# Patient Record
Sex: Male | Born: 1945 | Race: White | Hispanic: No
Health system: Southern US, Community
[De-identification: ages and names within clinical notes are randomized; demographics above are authoritative.]

---

## 2017-06-21 ENCOUNTER — Other Ambulatory Visit (HOSPITAL_COMMUNITY): Payer: Self-pay

## 2017-06-21 ENCOUNTER — Inpatient Hospital Stay
Admission: AD | Admit: 2017-06-21 | Discharge: 2017-07-16 | Disposition: A | Discharge status: Skilled Nursing Facility | Payer: Self-pay | Source: Ambulatory Visit | Attending: Internal Medicine | Admitting: Internal Medicine

## 2017-06-21 DIAGNOSIS — J189 Pneumonia, unspecified organism: Secondary | ICD-10-CM

## 2017-06-21 DIAGNOSIS — J9690 Respiratory failure, unspecified, unspecified whether with hypoxia or hypercapnia: Secondary | ICD-10-CM

## 2017-06-21 DIAGNOSIS — Z992 Dependence on renal dialysis: Secondary | ICD-10-CM

## 2017-06-21 DIAGNOSIS — R509 Fever, unspecified: Secondary | ICD-10-CM

## 2017-06-21 DIAGNOSIS — Z431 Encounter for attention to gastrostomy: Secondary | ICD-10-CM

## 2017-06-21 LAB — CBC
HEMATOCRIT: 28.1 % — AB (ref 39.0–52.0)
HEMOGLOBIN: 8.7 g/dL — AB (ref 13.0–17.0)
MCH: 30.4 pg (ref 26.0–34.0)
MCHC: 31 g/dL (ref 30.0–36.0)
MCV: 98.3 fL (ref 78.0–100.0)
Platelets: 276 10*3/uL (ref 150–400)
RBC: 2.86 MIL/uL — AB (ref 4.22–5.81)
RDW: 15.9 % — ABNORMAL HIGH (ref 11.5–15.5)
WBC: 10 10*3/uL (ref 4.0–10.5)

## 2017-06-21 LAB — PROTIME-INR
INR: 1.65
Prothrombin Time: 19.4 seconds — ABNORMAL HIGH (ref 11.4–15.2)

## 2017-06-21 LAB — APTT: aPTT: >200 seconds (ref 24–36)

## 2017-06-21 LAB — HEPARIN LEVEL (UNFRACTIONATED): Heparin Unfractionated: 0.55 IU/mL (ref 0.30–0.70)

## 2017-06-21 MED ORDER — IOPAMIDOL (ISOVUE-300) INJECTION 61%
INTRAVENOUS | Status: AC
  Filled 2017-06-21: qty 50

## 2017-06-22 LAB — COMPREHENSIVE METABOLIC PANEL
ALBUMIN: 2.2 g/dL — AB (ref 3.5–5.0)
ALT: 14 U/L — ABNORMAL LOW (ref 17–63)
ANION GAP: 14 (ref 5–15)
AST: 19 U/L (ref 15–41)
Alkaline Phosphatase: 84 U/L (ref 38–126)
BUN: 25 mg/dL — ABNORMAL HIGH (ref 6–20)
CO2: 22 mmol/L (ref 22–32)
Calcium: 9.7 mg/dL (ref 8.9–10.3)
Chloride: 100 mmol/L — ABNORMAL LOW (ref 101–111)
Creatinine, Ser: 1.74 mg/dL — ABNORMAL HIGH (ref 0.61–1.24)
GFR calc Af Amer: 43 mL/min — ABNORMAL LOW (ref >60)
GFR calc non Af Amer: 37 mL/min — ABNORMAL LOW (ref >60)
GLUCOSE: 108 mg/dL — AB (ref 65–99)
POTASSIUM: 4.1 mmol/L (ref 3.5–5.1)
SODIUM: 136 mmol/L (ref 135–145)
TOTAL PROTEIN: 5.9 g/dL — AB (ref 6.5–8.1)
Total Bilirubin: 0.8 mg/dL (ref 0.3–1.2)

## 2017-06-22 LAB — CBC WITH DIFFERENTIAL/PLATELET
BASOS ABS: 0.1 10*3/uL (ref 0.0–0.1)
Basophils Relative: 1 %
Eosinophils Absolute: 0.9 10*3/uL — ABNORMAL HIGH (ref 0.0–0.7)
Eosinophils Relative: 11 %
HEMATOCRIT: 27.1 % — AB (ref 39.0–52.0)
HEMOGLOBIN: 8.6 g/dL — AB (ref 13.0–17.0)
LYMPHS PCT: 13 %
Lymphs Abs: 1.1 10*3/uL (ref 0.7–4.0)
MCH: 30.9 pg (ref 26.0–34.0)
MCHC: 31.7 g/dL (ref 30.0–36.0)
MCV: 97.5 fL (ref 78.0–100.0)
MONO ABS: 0.6 10*3/uL (ref 0.1–1.0)
Monocytes Relative: 7 %
NEUTROS ABS: 6.1 10*3/uL (ref 1.7–7.7)
Neutrophils Relative %: 68 %
Platelets: 275 10*3/uL (ref 150–400)
RBC: 2.78 MIL/uL — ABNORMAL LOW (ref 4.22–5.81)
RDW: 16.1 % — AB (ref 11.5–15.5)
WBC: 8.9 10*3/uL (ref 4.0–10.5)

## 2017-06-22 LAB — HEPARIN LEVEL (UNFRACTIONATED)
HEPARIN UNFRACTIONATED: 0.5 [IU]/mL (ref 0.30–0.70)
Heparin Unfractionated: 0.1 IU/mL — ABNORMAL LOW (ref 0.30–0.70)
Heparin Unfractionated: 0.41 IU/mL (ref 0.30–0.70)

## 2017-06-22 LAB — PROTIME-INR
INR: 1.74
Prothrombin Time: 20.2 seconds — ABNORMAL HIGH (ref 11.4–15.2)

## 2017-06-23 LAB — HEPARIN LEVEL (UNFRACTIONATED)
HEPARIN UNFRACTIONATED: 0.28 [IU]/mL — AB (ref 0.30–0.70)
Heparin Unfractionated: 0.41 IU/mL (ref 0.30–0.70)
Heparin Unfractionated: 0.32 IU/mL (ref 0.30–0.70)

## 2017-06-23 LAB — PROTIME-INR
INR: 1.39
Prothrombin Time: 17 seconds — ABNORMAL HIGH (ref 11.4–15.2)

## 2017-06-24 LAB — PHOSPHORUS: Phosphorus: 4.4 mg/dL (ref 2.5–4.6)

## 2017-06-24 LAB — PROTIME-INR
INR: 1.45
PROTHROMBIN TIME: 17.5 s — AB (ref 11.4–15.2)

## 2017-06-24 LAB — COMPREHENSIVE METABOLIC PANEL
ALBUMIN: 2.4 g/dL — AB (ref 3.5–5.0)
ALT: 12 U/L — ABNORMAL LOW (ref 17–63)
AST: 18 U/L (ref 15–41)
Alkaline Phosphatase: 75 U/L (ref 38–126)
Anion gap: 10 (ref 5–15)
BUN: 58 mg/dL — ABNORMAL HIGH (ref 6–20)
CALCIUM: 10 mg/dL (ref 8.9–10.3)
CO2: 28 mmol/L (ref 22–32)
CREATININE: 2.36 mg/dL — AB (ref 0.61–1.24)
Chloride: 97 mmol/L — ABNORMAL LOW (ref 101–111)
GFR calc Af Amer: 30 mL/min — ABNORMAL LOW (ref >60)
GFR, EST NON AFRICAN AMERICAN: 26 mL/min — AB (ref >60)
GLUCOSE: 138 mg/dL — AB (ref 65–99)
Potassium: 4 mmol/L (ref 3.5–5.1)
Sodium: 135 mmol/L (ref 135–145)
TOTAL PROTEIN: 6.1 g/dL — AB (ref 6.5–8.1)
Total Bilirubin: 0.5 mg/dL (ref 0.3–1.2)

## 2017-06-24 LAB — TRIGLYCERIDES: TRIGLYCERIDES: 117 mg/dL (ref <150)

## 2017-06-24 LAB — HEPARIN LEVEL (UNFRACTIONATED)
HEPARIN UNFRACTIONATED: 0.26 [IU]/mL — AB (ref 0.30–0.70)
Heparin Unfractionated: 0.11 IU/mL — ABNORMAL LOW (ref 0.30–0.70)

## 2017-06-24 LAB — MAGNESIUM: Magnesium: 2.1 mg/dL (ref 1.7–2.4)

## 2017-06-24 NOTE — Consult Note (Signed)
[facility] CONSULT NOTE    Date: 06/24/2017                  Patient Name:  William Price  MRN: [id number]  DOB: 1946-02-26  Age / Sex: 71 y.o., male         PCP: Patient, No Pcp Per                 Service Requesting Consult: Hospitalist                 Reason for Consult: Acute renal failure            History of Present Illness: Patient is a 71 y.o. male with a PMHx of Parkinson's disease, history of recent aspiration pneumonia, pleural effusion, acute renal failure requiring dialysis, history of atrial fibrillation, diabetes mellitus type 2, tobacco abuse, hypertension, hyperlipidemia who was admitted to [facility] on 06/21/2017 for ongoing treatment of respiratory failure, recent aspiration pneumonia, DVT, and acute renal failure. The patient was admitted to an outside hospital on 05/15/2017 with complaints of chest pain and shortness of breath. At that time he was a days postoperative from a laparoscopic ventral hernia repair. He also was found to be in atrial fibrillation with RVR initially. Patient had dialysis catheter placed on 05/19/2017 and began hemodialysis. He is to remain on dialysis since that point in time. He also had an episode of hematuria that was evaluated by urology. In addition on 06/17/2017 a G-tube was placed for feeding.   Medications: Current medications: Heparin drip, Humalog insulin, aspirin 81 mg daily, Lipitor 10 mg daily at bedtime, diltiazem 60 mg every 6 hours, famotidine 20 mg daily, ipratropium/albuterol 3 ML's inhaled 3 times a day, nicotine 14 mg patch daily, liquid protein 30 cc twice a day, Seroquel 25 mg daily at bedtime, probiotic 1 tablet twice a day  Allergies: No known drug allergies   Past Medical History: Hypertension Hyperlipidemia Atrial fibrillation Episode of acute renal failure requiring dialysis Recent respiratory failure History of Parkinson's disease Recent DVT    Past Surgical  History: Ventral hernia repair Dialysis catheter placement  Family History: No family history of end-stage renal disease.  Social History: Patient is a widower. Lives alone. Recently stopped smoking tobacco.   Review of Systems: Review of Systems  Constitutional: Positive for malaise/fatigue and weight loss. Negative for chills and fever.  HENT: Negative for congestion, hearing loss and sinus pain.   Eyes: Negative for blurred vision and double vision.  Respiratory: Positive for cough, sputum production and shortness of breath.   Cardiovascular: Negative for chest pain, palpitations and orthopnea.  Gastrointestinal: Negative for heartburn, nausea and vomiting.  Genitourinary: Positive for hematuria.  Musculoskeletal: Negative for joint pain and myalgias.  Skin: Negative for itching and rash.  Neurological: Positive for weakness. Negative for dizziness and focal weakness.  Endo/Heme/Allergies: Negative for polydipsia. Does not bruise/bleed easily.  Psychiatric/Behavioral: Negative for depression. The patient is not nervous/anxious.      Vital Signs: Temperature 97.8 pulse 89 respirations 30 blood pressure 140/68   Physical Exam: General: NAD, Resting in bed comfortably   Head: Normocephalic, atraumatic.  Eyes: Anicteric, EOMI  Nose: Mucous membranes moist, not inflammed, nonerythematous.  Throat: Oropharynx nonerythematous, no exudate appreciated.   Neck: Supple, trachea midline.  Lungs:  Scattered rhonchi, normal effort   Heart: S1S2 no rubs  Abdomen:  BS normoactive. Soft, Nondistended, non-tender.  No masses or organomegaly.  Extremities: No pretibial edema.  Neurologic:  A&O X3, Motor strength is 5/5 in the all 4 extremities  Skin: No visible rashes, scars.    Lab results: Basic Metabolic Panel:  Recent Labs Lab 06/22/17 0419  NA 136  K 4.1  CL 100*  CO2 22  GLUCOSE 108*  BUN 25*  CREATININE 1.74*  CALCIUM 9.7    Liver Function Tests:  Recent  Labs Lab 06/22/17 0419  AST 19  ALT 14*  ALKPHOS 84  BILITOT 0.8  PROT 5.9*  ALBUMIN 2.2*   No results for input(s): LIPASE, AMYLASE in the last 168 hours. No results for input(s): AMMONIA in the last 168 hours.  CBC:  Recent Labs Lab 06/21/17 1849 06/22/17 0419  WBC 10.0 8.9  NEUTROABS  --  6.1  HGB 8.7* 8.6*  HCT 28.1* 27.1*  MCV 98.3 97.5  PLT 276 275    Cardiac Enzymes: No results for input(s): CKTOTAL, CKMB, CKMBINDEX, TROPONINI in the last 168 hours.  BNP: Invalid input(s): POCBNP  CBG: No results for input(s): GLUCAP in the last 168 hours.  Microbiology: No results found for this or any previous visit.  Coagulation Studies:  Recent Labs  06/22/17 0419 06/23/17 1347 06/24/17 0514  LABPROT 20.2* 17.0* 17.5*  INR 1.74 1.39 1.45    Urinalysis: No results for input(s): COLORURINE, LABSPEC, PHURINE, GLUCOSEU, HGBUR, BILIRUBINUR, KETONESUR, PROTEINUR, UROBILINOGEN, NITRITE, LEUKOCYTESUR in the last 72 hours.  Invalid input(s): APPERANCEUR    Imaging:  No results found.   Assessment & Plan: Pt is a 71 y.o. male with a PMHx of Parkinson's disease, history of recent aspiration pneumonia, pleural effusion, acute renal failure requiring dialysis, history of atrial fibrillation, diabetes mellitus type 2, tobacco abuse, hypertension, hyperlipidemia who was admitted to [facility] on 06/21/2017 for ongoing treatment of respiratory failure, recent aspiration pneumonia, DVT, and acute renal failure.  1. 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  The patient's baseline creatinine appears to be 1.13. He suffered an episode of acute tubular necrosis while at the outside hospital that ended up requiring hemodialysis. We will plan for dialysis treatment again tomorrow. Ultrafiltration target 1.5 kg.  2. Anemia of chronic kidney disease.  Hemoglobin currently 8.6. We will start the patient on Aranesp per protocol.  3. Secondary hyperparathyroidism.  Check intact PTH and phosphorus with next dialysis treatment.  4. Thanks for consultation.

## 2017-06-25 LAB — BASIC METABOLIC PANEL
ANION GAP: 9 (ref 5–15)
BUN: 67 mg/dL — AB (ref 6–20)
CALCIUM: 10.4 mg/dL — AB (ref 8.9–10.3)
CO2: 28 mmol/L (ref 22–32)
Chloride: 98 mmol/L — ABNORMAL LOW (ref 101–111)
Creatinine, Ser: 2.36 mg/dL — ABNORMAL HIGH (ref 0.61–1.24)
GFR calc Af Amer: 30 mL/min — ABNORMAL LOW (ref >60)
GFR, EST NON AFRICAN AMERICAN: 26 mL/min — AB (ref >60)
GLUCOSE: 158 mg/dL — AB (ref 65–99)
POTASSIUM: 4.1 mmol/L (ref 3.5–5.1)
SODIUM: 135 mmol/L (ref 135–145)

## 2017-06-25 LAB — CBC WITH DIFFERENTIAL/PLATELET
BASOS PCT: 0 %
Basophils Absolute: 0 10*3/uL (ref 0.0–0.1)
EOS ABS: 0.2 10*3/uL (ref 0.0–0.7)
EOS PCT: 2 %
HCT: 28.7 % — ABNORMAL LOW (ref 39.0–52.0)
HEMOGLOBIN: 8.9 g/dL — AB (ref 13.0–17.0)
Lymphocytes Relative: 8 %
Lymphs Abs: 0.8 10*3/uL (ref 0.7–4.0)
MCH: 30.7 pg (ref 26.0–34.0)
MCHC: 31 g/dL (ref 30.0–36.0)
MCV: 99 fL (ref 78.0–100.0)
Monocytes Absolute: 0.8 10*3/uL (ref 0.1–1.0)
Monocytes Relative: 8 %
NEUTROS PCT: 82 %
Neutro Abs: 8.3 10*3/uL — ABNORMAL HIGH (ref 1.7–7.7)
PLATELETS: 331 10*3/uL (ref 150–400)
RBC: 2.9 MIL/uL — AB (ref 4.22–5.81)
RDW: 16.2 % — ABNORMAL HIGH (ref 11.5–15.5)
WBC: 10.2 10*3/uL (ref 4.0–10.5)

## 2017-06-25 LAB — ALBUMIN: ALBUMIN: 2.5 g/dL — AB (ref 3.5–5.0)

## 2017-06-25 LAB — MAGNESIUM: MAGNESIUM: 2.3 mg/dL (ref 1.7–2.4)

## 2017-06-25 LAB — PROTIME-INR
INR: 2.24
PROTHROMBIN TIME: 24.6 s — AB (ref 11.4–15.2)

## 2017-06-25 LAB — HEPARIN LEVEL (UNFRACTIONATED)
HEPARIN UNFRACTIONATED: 0.2 [IU]/mL — AB (ref 0.30–0.70)
Heparin Unfractionated: 0.25 IU/mL — ABNORMAL LOW (ref 0.30–0.70)

## 2017-06-25 LAB — PHOSPHORUS: PHOSPHORUS: 5 mg/dL — AB (ref 2.5–4.6)

## 2017-06-26 LAB — HEPATITIS B SURFACE ANTIGEN: Hepatitis B Surface Ag: NEGATIVE

## 2017-06-26 LAB — BASIC METABOLIC PANEL
Anion gap: 10 (ref 5–15)
BUN: 47 mg/dL — AB (ref 6–20)
CO2: 27 mmol/L (ref 22–32)
Calcium: 9.8 mg/dL (ref 8.9–10.3)
Chloride: 98 mmol/L — ABNORMAL LOW (ref 101–111)
Creatinine, Ser: 1.96 mg/dL — ABNORMAL HIGH (ref 0.61–1.24)
GFR calc Af Amer: 38 mL/min — ABNORMAL LOW (ref >60)
GFR, EST NON AFRICAN AMERICAN: 32 mL/min — AB (ref >60)
GLUCOSE: 137 mg/dL — AB (ref 65–99)
Potassium: 4 mmol/L (ref 3.5–5.1)
Sodium: 135 mmol/L (ref 135–145)

## 2017-06-26 LAB — PROTIME-INR
INR: 2.96
Prothrombin Time: 30.6 seconds — ABNORMAL HIGH (ref 11.4–15.2)

## 2017-06-26 LAB — HEPATITIS B SURFACE ANTIBODY, QUANTITATIVE: Hep B S AB Quant (Post): <3.1 m[IU]/mL — ABNORMAL LOW (ref >9.9)

## 2017-06-26 LAB — PARATHYROID HORMONE, INTACT (NO CA): PTH: 23 pg/mL (ref 15–65)

## 2017-06-26 LAB — HEPATITIS B CORE ANTIBODY, IGM: HEP B C IGM: NEGATIVE

## 2017-06-26 LAB — HEPARIN LEVEL (UNFRACTIONATED)

## 2017-06-26 NOTE — Progress Notes (Signed)
  [facility]  ROUNDING NOTE   Subjective:  Unclear if renal function improving.  Cr down to 1.96 but this is under the influence of dialysis.   Objective:  Vital signs in last 24 hours:  Temperature 99.2 Pulse 105 Respirations 25 Blood pressure: 149/81  Physical Exam: General: No acute distress  Head: Normocephalic, atraumatic. Moist oral mucosal membranes  Eyes: Anicteric  Neck: Supple, trachea midline  Lungs:  Scattered rhonchi, normal effort  Heart: S1S2 no rubs  Abdomen:  Soft, nontender, bowel sounds present  Extremities: trace peripheral edema.  Neurologic: Awake, alert, following commands  Skin: No lesions  Access: R IJ permcath    Basic Metabolic Panel:  Recent Labs Lab 06/22/17 0419 06/24/17 1253 06/25/17 0657 06/26/17 0701  NA 136 135 135 135  K 4.1 4.0 4.1 4.0  CL 100* 97* 98* 98*  CO2 22 28 28 27   GLUCOSE 108* 138* 158* 137*  BUN 25* 58* 67* 47*  CREATININE 1.74* 2.36* 2.36* 1.96*  CALCIUM 9.7 10.0 10.4* 9.8  MG  --  2.1 2.3  --   PHOS  --  4.4 5.0*  --     Liver Function Tests:  Recent Labs Lab 06/22/17 0419 06/24/17 1253 06/25/17 0657  AST 19 18  --   ALT 14* 12*  --   ALKPHOS 84 75  --   BILITOT 0.8 0.5  --   PROT 5.9* 6.1*  --   ALBUMIN 2.2* 2.4* 2.5*   No results for input(s): LIPASE, AMYLASE in the last 168 hours. No results for input(s): AMMONIA in the last 168 hours.  CBC:  Recent Labs Lab 06/21/17 1849 06/22/17 0419 06/25/17 0657  WBC 10.0 8.9 10.2  NEUTROABS  --  6.1 8.3*  HGB 8.7* 8.6* 8.9*  HCT 28.1* 27.1* 28.7*  MCV 98.3 97.5 99.0  PLT 276 275 331    Cardiac Enzymes: No results for input(s): CKTOTAL, CKMB, CKMBINDEX, TROPONINI in the last 168 hours.  BNP: Invalid input(s): POCBNP  CBG: No results for input(s): GLUCAP in the last 168 hours.  Microbiology: No results found for this or any previous visit.  Coagulation Studies:  Recent Labs  06/24/17 0514 06/25/17 0657 06/26/17 0701   LABPROT 17.5* 24.6* 30.6*  INR 1.45 2.24 2.96    Urinalysis: No results for input(s): COLORURINE, LABSPEC, PHURINE, GLUCOSEU, HGBUR, BILIRUBINUR, KETONESUR, PROTEINUR, UROBILINOGEN, NITRITE, LEUKOCYTESUR in the last 72 hours.  Invalid input(s): APPERANCEUR    Imaging: No results found.   Medications:       Assessment/ Plan:  71 y.o. male with a PMHx of Parkinson's disease, history of recent aspiration pneumonia, pleural effusion, acute renal failure requiring dialysis, history of atrial fibrillation, diabetes mellitus type 2, tobacco abuse, hypertension, hyperlipidemia who was admitted to [facility] on 06/21/2017 for ongoing treatment of respiratory failure, recent aspiration pneumonia, DVT, and acute renal failure.  1. 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  BUN down to 47 with a creatinine of 1.96. Patient may be experiencing some renal recovery. We will plan for 1 additional dialysis treatment tomorrow and reassess thereafter.  2. Anemia of chronic kidney disease.   Hemoglobin up to 8.9. Continue to monitor CBC.  3. Secondary hyperparathyroidism.  Phosphorus 5.0 and at target yesterday. Continue to monitor.   LOS: 0 Linah Klapper 9/5/20183:34 PM

## 2017-06-27 ENCOUNTER — Other Ambulatory Visit (HOSPITAL_COMMUNITY): Payer: Self-pay

## 2017-06-27 LAB — CBC
HEMATOCRIT: 28.3 % — AB (ref 39.0–52.0)
Hemoglobin: 8.8 g/dL — ABNORMAL LOW (ref 13.0–17.0)
MCH: 31.2 pg (ref 26.0–34.0)
MCHC: 31.1 g/dL (ref 30.0–36.0)
MCV: 100.4 fL — ABNORMAL HIGH (ref 78.0–100.0)
Platelets: 296 10*3/uL (ref 150–400)
RBC: 2.82 MIL/uL — ABNORMAL LOW (ref 4.22–5.81)
RDW: 16.8 % — AB (ref 11.5–15.5)
WBC: 10.2 10*3/uL (ref 4.0–10.5)

## 2017-06-27 LAB — BASIC METABOLIC PANEL
ANION GAP: 10 (ref 5–15)
BUN: 60 mg/dL — AB (ref 6–20)
CALCIUM: 10.3 mg/dL (ref 8.9–10.3)
CO2: 30 mmol/L (ref 22–32)
Chloride: 97 mmol/L — ABNORMAL LOW (ref 101–111)
Creatinine, Ser: 2.13 mg/dL — ABNORMAL HIGH (ref 0.61–1.24)
GFR calc Af Amer: 34 mL/min — ABNORMAL LOW (ref >60)
GFR, EST NON AFRICAN AMERICAN: 29 mL/min — AB (ref >60)
Glucose, Bld: 147 mg/dL — ABNORMAL HIGH (ref 65–99)
POTASSIUM: 3.8 mmol/L (ref 3.5–5.1)
Sodium: 137 mmol/L (ref 135–145)

## 2017-06-27 LAB — RENAL FUNCTION PANEL
ALBUMIN: 2.5 g/dL — AB (ref 3.5–5.0)
ALBUMIN: 2.4 g/dL — AB (ref 3.5–5.0)
Anion gap: 11 (ref 5–15)
Anion gap: 7 (ref 5–15)
BUN: 59 mg/dL — AB (ref 6–20)
BUN: 19 mg/dL (ref 6–20)
CALCIUM: 10.3 mg/dL (ref 8.9–10.3)
CALCIUM: 8.8 mg/dL — AB (ref 8.9–10.3)
CO2: 29 mmol/L (ref 22–32)
CO2: 29 mmol/L (ref 22–32)
CREATININE: 2.11 mg/dL — AB (ref 0.61–1.24)
CREATININE: 1.01 mg/dL (ref 0.61–1.24)
Chloride: 97 mmol/L — ABNORMAL LOW (ref 101–111)
Chloride: 99 mmol/L — ABNORMAL LOW (ref 101–111)
GFR calc Af Amer: 34 mL/min — ABNORMAL LOW (ref >60)
GFR calc Af Amer: >60 mL/min (ref >60)
GFR calc non Af Amer: >60 mL/min (ref >60)
GFR, EST NON AFRICAN AMERICAN: 30 mL/min — AB (ref >60)
GLUCOSE: 146 mg/dL — AB (ref 65–99)
Glucose, Bld: 147 mg/dL — ABNORMAL HIGH (ref 65–99)
PHOSPHORUS: 4.2 mg/dL (ref 2.5–4.6)
PHOSPHORUS: 1.8 mg/dL — AB (ref 2.5–4.6)
POTASSIUM: 3.8 mmol/L (ref 3.5–5.1)
Potassium: 3.5 mmol/L (ref 3.5–5.1)
SODIUM: 137 mmol/L (ref 135–145)
SODIUM: 135 mmol/L (ref 135–145)

## 2017-06-27 LAB — PROTIME-INR
INR: 3.03
Prothrombin Time: 31.1 seconds — ABNORMAL HIGH (ref 11.4–15.2)

## 2017-06-27 LAB — HEPARIN LEVEL (UNFRACTIONATED)

## 2017-06-27 LAB — MAGNESIUM: MAGNESIUM: 2.3 mg/dL (ref 1.7–2.4)

## 2017-06-28 LAB — RENAL FUNCTION PANEL
ANION GAP: 7 (ref 5–15)
Albumin: 2.4 g/dL — ABNORMAL LOW (ref 3.5–5.0)
BUN: 37 mg/dL — ABNORMAL HIGH (ref 6–20)
CALCIUM: 9.5 mg/dL (ref 8.9–10.3)
CO2: 31 mmol/L (ref 22–32)
Chloride: 98 mmol/L — ABNORMAL LOW (ref 101–111)
Creatinine, Ser: 1.62 mg/dL — ABNORMAL HIGH (ref 0.61–1.24)
GFR calc non Af Amer: 41 mL/min — ABNORMAL LOW (ref >60)
GFR, EST AFRICAN AMERICAN: 47 mL/min — AB (ref >60)
Glucose, Bld: 127 mg/dL — ABNORMAL HIGH (ref 65–99)
Phosphorus: 3.4 mg/dL (ref 2.5–4.6)
Potassium: 3.7 mmol/L (ref 3.5–5.1)
SODIUM: 136 mmol/L (ref 135–145)

## 2017-06-28 LAB — BASIC METABOLIC PANEL
Anion gap: 7 (ref 5–15)
BUN: 37 mg/dL — AB (ref 6–20)
CHLORIDE: 98 mmol/L — AB (ref 101–111)
CO2: 30 mmol/L (ref 22–32)
CREATININE: 1.64 mg/dL — AB (ref 0.61–1.24)
Calcium: 9.4 mg/dL (ref 8.9–10.3)
GFR calc Af Amer: 47 mL/min — ABNORMAL LOW (ref >60)
GFR calc non Af Amer: 40 mL/min — ABNORMAL LOW (ref >60)
GLUCOSE: 128 mg/dL — AB (ref 65–99)
Potassium: 3.7 mmol/L (ref 3.5–5.1)
Sodium: 135 mmol/L (ref 135–145)

## 2017-06-28 LAB — PROTIME-INR
INR: 2.43
Prothrombin Time: 26.2 seconds — ABNORMAL HIGH (ref 11.4–15.2)

## 2017-06-28 LAB — CBC
HEMATOCRIT: 28.8 % — AB (ref 39.0–52.0)
HEMOGLOBIN: 8.7 g/dL — AB (ref 13.0–17.0)
MCH: 30.3 pg (ref 26.0–34.0)
MCHC: 30.2 g/dL (ref 30.0–36.0)
MCV: 100.3 fL — ABNORMAL HIGH (ref 78.0–100.0)
Platelets: 316 10*3/uL (ref 150–400)
RBC: 2.87 MIL/uL — ABNORMAL LOW (ref 4.22–5.81)
RDW: 16.5 % — ABNORMAL HIGH (ref 11.5–15.5)
WBC: 10.1 10*3/uL (ref 4.0–10.5)

## 2017-06-28 LAB — HEPARIN LEVEL (UNFRACTIONATED): Heparin Unfractionated: <0.1 IU/mL — ABNORMAL LOW (ref 0.30–0.70)

## 2017-06-28 LAB — MAGNESIUM: Magnesium: 2 mg/dL (ref 1.7–2.4)

## 2017-06-28 NOTE — Progress Notes (Signed)
[facility]  ROUNDING NOTE   Subjective:  Patient seen at bedside.  Renal function improved. Patient did make some urine yesterday approximately 1 L. He also had some pulmonary vascular congestion on chest x-ray yesterday.  Objective:  Vital signs in last 24 hours:  Temperature 98.6 pulse 82 respirations 25 blood pressure 124/69   Physical Exam: General: No acute distress  Head: Normocephalic, atraumatic. Moist oral mucosal membranes  Eyes: Anicteric  Neck: Supple, trachea midline  Lungs:  Scattered rhonchi, normal effort  Heart: S1S2 no rubs  Abdomen:  Soft, nontender, bowel sounds present  Extremities: trace peripheral edema.  Neurologic: Awake, alert, following commands  Skin: No lesions  Access: R IJ permcath    Basic Metabolic Panel:  Recent Labs Lab 06/24/17 1253 06/25/17 0657 06/26/17 0701 06/27/17 0621 06/27/17 1419 06/28/17 0531  NA 135 135 135 137  137 135 135  136  K 4.0 4.1 4.0 3.8  3.8 3.5 3.7  3.7  CL 97* 98* 98* 97*  97* 99* 98*  98*  CO2 28 28 27 29  30 29 30  31   GLUCOSE 138* 158* 137* 147*  147* 146* 128*  127*  BUN 58* 67* 47* 59*  60* 19 37*  37*  CREATININE 2.36* 2.36* 1.96* 2.11*  2.13* 1.01 1.64*  1.62*  CALCIUM 10.0 10.4* 9.8 10.3  10.3 8.8* 9.4  9.5  MG 2.1 2.3  --  2.3  --  2.0  PHOS 4.4 5.0*  --  4.2 1.8* 3.4    Liver Function Tests:  Recent Labs Lab 06/22/17 0419 06/24/17 1253 06/25/17 0657 06/27/17 0621 06/27/17 1419 06/28/17 0531  AST 19 18  --   --   --   --   ALT 14* 12*  --   --   --   --   ALKPHOS 84 75  --   --   --   --   BILITOT 0.8 0.5  --   --   --   --   PROT 5.9* 6.1*  --   --   --   --   ALBUMIN 2.2* 2.4* 2.5* 2.5* 2.4* 2.4*   No results for input(s): LIPASE, AMYLASE in the last 168 hours. No results for input(s): AMMONIA in the last 168 hours.  CBC:  Recent Labs Lab 06/21/17 1849 06/22/17 0419 06/25/17 0657 06/27/17 0621 06/28/17 0531  WBC 10.0 8.9 10.2 10.2 10.1   NEUTROABS  --  6.1 8.3*  --   --   HGB 8.7* 8.6* 8.9* 8.8* 8.7*  HCT 28.1* 27.1* 28.7* 28.3* 28.8*  MCV 98.3 97.5 99.0 100.4* 100.3*  PLT 276 275 331 296 316    Cardiac Enzymes: No results for input(s): CKTOTAL, CKMB, CKMBINDEX, TROPONINI in the last 168 hours.  BNP: Invalid input(s): POCBNP  CBG: No results for input(s): GLUCAP in the last 168 hours.  Microbiology: No results found for this or any previous visit.  Coagulation Studies:  Recent Labs  06/26/17 0701 06/27/17 0617 06/28/17 0531  LABPROT 30.6* 31.1* 26.2*  INR 2.96 3.03 2.43    Urinalysis: No results for input(s): COLORURINE, LABSPEC, PHURINE, GLUCOSEU, HGBUR, BILIRUBINUR, KETONESUR, PROTEINUR, UROBILINOGEN, NITRITE, LEUKOCYTESUR in the last 72 hours.  Invalid input(s): APPERANCEUR    Imaging: Dg Chest Port 1 View  Result Date: 06/27/2017 CLINICAL DATA:  Shortness of Breath EXAM: PORTABLE CHEST 1 VIEW COMPARISON:  June 21, 2017 FINDINGS: There are bilateral pleural effusions. There is interstitial edema bilaterally. There is patchy airspace  opacity in the upper lobes, more on the left than on the right. There is consolidation in the left lower lobe. There is cardiomegaly with pulmonary venous hypertension. No evident adenopathy. Central catheter tip is at the cavoatrial junction. No pneumothorax. There is aortic atherosclerosis. No bone lesions. IMPRESSION: Findings indicative of congestive heart failure with increase in edema compared to most recent study. Suspect alveolar edema left upper lobe. Consolidation left lower lobe raises concern for superimposed pneumonia, although this opacity could be due to a combination of alveolar edema and atelectasis. There is aortic atherosclerosis. Central catheter as described without pneumothorax. Aortic Atherosclerosis (ICD10-I70.0). Electronically Signed   By: Bretta Bang III M.D.   On: 06/27/2017 11:05     Medications:       Assessment/ Plan:  71 y.o.  male with a PMHx of Parkinson's disease, history of recent aspiration pneumonia, pleural effusion, acute renal failure requiring dialysis, history of atrial fibrillation, diabetes mellitus type 2, tobacco abuse, hypertension, hyperlipidemia who was admitted to [facility] on 06/21/2017 for ongoing treatment of respiratory failure, recent aspiration pneumonia, DVT, and acute renal failure.  1. 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  renal parameters continue to improve. BUN currently down to 37 with a creatinine 1.6. Therefore we will hold further dialysis treatments. We may need to reinitiate if renal function starts to deteriorate.  Pulmonary avascular congestion noted on chest x-ray. Okay to administer Lasix at this time.  2. Anemia of chronic kidney disease.   Hemoglobin currently 8.7. Hold off on Aranesp for now.  3. Secondary hyperparathyroidism.  Phosphorus currently 3.4 and at target. Continue to monitor bone marrow metabolism parameters.  LOS: 0 [place] Every 9/7/20188:22 AM

## 2017-06-29 LAB — PROTIME-INR
INR: 2.28
PROTHROMBIN TIME: 24.9 s — AB (ref 11.4–15.2)

## 2017-06-29 LAB — HEPARIN LEVEL (UNFRACTIONATED): Heparin Unfractionated: <0.1 IU/mL — ABNORMAL LOW (ref 0.30–0.70)

## 2017-06-30 LAB — HEPARIN LEVEL (UNFRACTIONATED): Heparin Unfractionated: <0.1 IU/mL — ABNORMAL LOW (ref 0.30–0.70)

## 2017-07-01 LAB — COMPREHENSIVE METABOLIC PANEL
ALK PHOS: 68 U/L (ref 38–126)
ALT: 15 U/L — AB (ref 17–63)
AST: 17 U/L (ref 15–41)
Albumin: 2.4 g/dL — ABNORMAL LOW (ref 3.5–5.0)
Anion gap: 9 (ref 5–15)
BILIRUBIN TOTAL: 0.6 mg/dL (ref 0.3–1.2)
BUN: 77 mg/dL — AB (ref 6–20)
CHLORIDE: 97 mmol/L — AB (ref 101–111)
CO2: 33 mmol/L — ABNORMAL HIGH (ref 22–32)
CREATININE: 2.48 mg/dL — AB (ref 0.61–1.24)
Calcium: 9.9 mg/dL (ref 8.9–10.3)
GFR calc Af Amer: 28 mL/min — ABNORMAL LOW (ref >60)
GFR, EST NON AFRICAN AMERICAN: 24 mL/min — AB (ref >60)
Glucose, Bld: 119 mg/dL — ABNORMAL HIGH (ref 65–99)
Potassium: 3.2 mmol/L — ABNORMAL LOW (ref 3.5–5.1)
Sodium: 139 mmol/L (ref 135–145)
TOTAL PROTEIN: 6.3 g/dL — AB (ref 6.5–8.1)

## 2017-07-01 LAB — CBC
HEMATOCRIT: 28.9 % — AB (ref 39.0–52.0)
Hemoglobin: 8.8 g/dL — ABNORMAL LOW (ref 13.0–17.0)
MCH: 30.2 pg (ref 26.0–34.0)
MCHC: 30.4 g/dL (ref 30.0–36.0)
MCV: 99.3 fL (ref 78.0–100.0)
Platelets: 314 10*3/uL (ref 150–400)
RBC: 2.91 MIL/uL — ABNORMAL LOW (ref 4.22–5.81)
RDW: 16.5 % — AB (ref 11.5–15.5)
WBC: 11 10*3/uL — AB (ref 4.0–10.5)

## 2017-07-01 LAB — TRIGLYCERIDES: Triglycerides: 106 mg/dL (ref <150)

## 2017-07-01 LAB — PROTIME-INR
INR: 2.65
Prothrombin Time: 28 seconds — ABNORMAL HIGH (ref 11.4–15.2)

## 2017-07-01 LAB — MAGNESIUM: Magnesium: 2 mg/dL (ref 1.7–2.4)

## 2017-07-01 LAB — PHOSPHORUS: Phosphorus: 4.8 mg/dL — ABNORMAL HIGH (ref 2.5–4.6)

## 2017-07-01 NOTE — Progress Notes (Signed)
[facility]  ROUNDING NOTE   Subjective:  Patient states that he is making 812 100 cc of urine per day.  renal function appears to be worse in terms of BUN and creatinine however.   Objective:  Vital signs in last 24 hours:  Temperature 97.5 pulse 72 respirations 31 blood pressure 148/76  Physical Exam: General: No acute distress  Head: Normocephalic, atraumatic. Moist oral mucosal membranes  Eyes: Anicteric  Neck: Supple, trachea midline  Lungs:  Scattered rhonchi, normal effort  Heart: S1S2 no rubs  Abdomen:  Soft, nontender, bowel sounds present  Extremities: trace peripheral edema.  Neurologic: Awake, alert, following commands  Skin: No lesions  Access: R IJ permcath    Basic Metabolic Panel:  Recent Labs Lab 06/25/17 0657 06/26/17 0701 06/27/17 0621 06/27/17 1419 06/28/17 0531 07/01/17 0508  NA 135 135 137  137 135 135  136 139  K 4.1 4.0 3.8  3.8 3.5 3.7  3.7 3.2*  CL 98* 98* 97*  97* 99* 98*  98* 97*  CO2 28 27 29  30 29 30  31  33*  GLUCOSE 158* 137* 147*  147* 146* 128*  127* 119*  BUN 67* 47* 59*  60* 19 37*  37* 77*  CREATININE 2.36* 1.96* 2.11*  2.13* 1.01 1.64*  1.62* 2.48*  CALCIUM 10.4* 9.8 10.3  10.3 8.8* 9.4  9.5 9.9  MG 2.3  --  2.3  --  2.0 2.0  PHOS 5.0*  --  4.2 1.8* 3.4 4.8*    Liver Function Tests:  Recent Labs Lab 06/25/17 0657 06/27/17 0621 06/27/17 1419 06/28/17 0531 07/01/17 0508  AST  --   --   --   --  17  ALT  --   --   --   --  15*  ALKPHOS  --   --   --   --  68  BILITOT  --   --   --   --  0.6  PROT  --   --   --   --  6.3*  ALBUMIN 2.5* 2.5* 2.4* 2.4* 2.4*   No results for input(s): LIPASE, AMYLASE in the last 168 hours. No results for input(s): AMMONIA in the last 168 hours.  CBC:  Recent Labs Lab 06/25/17 0657 06/27/17 0621 06/28/17 0531 07/01/17 0508  WBC 10.2 10.2 10.1 11.0*  NEUTROABS 8.3*  --   --   --   HGB 8.9* 8.8* 8.7* 8.8*  HCT 28.7* 28.3* 28.8* 28.9*  MCV 99.0  100.4* 100.3* 99.3  PLT 331 296 316 314    Cardiac Enzymes: No results for input(s): CKTOTAL, CKMB, CKMBINDEX, TROPONINI in the last 168 hours.  BNP: Invalid input(s): POCBNP  CBG: No results for input(s): GLUCAP in the last 168 hours.  Microbiology: No results found for this or any previous visit.  Coagulation Studies:  Recent Labs  06/29/17 0459 07/01/17 0508  LABPROT 24.9* 28.0*  INR 2.28 2.65    Urinalysis: No results for input(s): COLORURINE, LABSPEC, PHURINE, GLUCOSEU, HGBUR, BILIRUBINUR, KETONESUR, PROTEINUR, UROBILINOGEN, NITRITE, LEUKOCYTESUR in the last 72 hours.  Invalid input(s): APPERANCEUR    Imaging: No results found.   Medications:       Assessment/ Plan:  71 y.o. male with a PMHx of Parkinson's disease, history of recent aspiration pneumonia, pleural effusion, acute renal failure requiring dialysis, history of atrial fibrillation, diabetes mellitus type 2, tobacco abuse, hypertension, hyperlipidemia who was admitted to [facility] on 06/21/2017 for ongoing treatment of respiratory  failure, recent aspiration pneumonia, DVT, and acute renal failure.  1. 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  BUN and creatinine are higher at the moment and 77 and 2.48 respectively. However patient is making adequate amounts of urine. Therefore no urgent indication for dialysis at the moment. We will reassess for this on Wednesday and Friday.  2. Anemia of chronic kidney disease.   Hemoglobin relatively stable at 8.8. Continue to monitor.  3. Secondary hyperparathyroidism.  Phosphorus currently up to 4.8. We will need to continue to monitor this.   LOS: 0 Elo Marmolejos 9/10/20183:25 PM

## 2017-07-02 ENCOUNTER — Other Ambulatory Visit (HOSPITAL_COMMUNITY): Payer: Self-pay

## 2017-07-02 LAB — POTASSIUM: Potassium: 3.3 mmol/L — ABNORMAL LOW (ref 3.5–5.1)

## 2017-07-03 LAB — CBC
HEMATOCRIT: 30.7 % — AB (ref 39.0–52.0)
HEMOGLOBIN: 9.5 g/dL — AB (ref 13.0–17.0)
MCH: 30.8 pg (ref 26.0–34.0)
MCHC: 30.9 g/dL (ref 30.0–36.0)
MCV: 99.7 fL (ref 78.0–100.0)
PLATELETS: 289 10*3/uL (ref 150–400)
RBC: 3.08 MIL/uL — AB (ref 4.22–5.81)
RDW: 16.3 % — ABNORMAL HIGH (ref 11.5–15.5)
WBC: 11.6 10*3/uL — ABNORMAL HIGH (ref 4.0–10.5)

## 2017-07-03 LAB — RENAL FUNCTION PANEL
ANION GAP: 13 (ref 5–15)
Albumin: 2.7 g/dL — ABNORMAL LOW (ref 3.5–5.0)
BUN: 97 mg/dL — ABNORMAL HIGH (ref 6–20)
CHLORIDE: 92 mmol/L — AB (ref 101–111)
CO2: 31 mmol/L (ref 22–32)
Calcium: 9.9 mg/dL (ref 8.9–10.3)
Creatinine, Ser: 2.73 mg/dL — ABNORMAL HIGH (ref 0.61–1.24)
GFR, EST AFRICAN AMERICAN: 25 mL/min — AB (ref >60)
GFR, EST NON AFRICAN AMERICAN: 22 mL/min — AB (ref >60)
Glucose, Bld: 111 mg/dL — ABNORMAL HIGH (ref 65–99)
POTASSIUM: 3.9 mmol/L (ref 3.5–5.1)
Phosphorus: 4.5 mg/dL (ref 2.5–4.6)
Sodium: 136 mmol/L (ref 135–145)

## 2017-07-03 LAB — BASIC METABOLIC PANEL
Anion gap: 13 (ref 5–15)
BUN: 100 mg/dL — AB (ref 6–20)
CHLORIDE: 93 mmol/L — AB (ref 101–111)
CO2: 29 mmol/L (ref 22–32)
CREATININE: 2.69 mg/dL — AB (ref 0.61–1.24)
Calcium: 9.5 mg/dL (ref 8.9–10.3)
GFR calc Af Amer: 26 mL/min — ABNORMAL LOW (ref >60)
GFR calc non Af Amer: 22 mL/min — ABNORMAL LOW (ref >60)
Glucose, Bld: 108 mg/dL — ABNORMAL HIGH (ref 65–99)
POTASSIUM: 4.2 mmol/L (ref 3.5–5.1)
Sodium: 135 mmol/L (ref 135–145)

## 2017-07-03 LAB — PROTIME-INR
INR: 3.02
PROTHROMBIN TIME: 31.1 s — AB (ref 11.4–15.2)

## 2017-07-03 NOTE — Progress Notes (Signed)
[facility]  ROUNDING NOTE   Subjective:  Renal parameters appear to be worsening. BUN up to 100.  therefore we have decided to proceed with dialysis today. Patient sitting up in chair.   Objective:  Vital signs in last 24 hours:  Temperature 96.4 pulse 76 respirations 32 blood pressure 122/71  Physical Exam: General: No acute distress  Head: Normocephalic, atraumatic. Moist oral mucosal membranes  Eyes: Anicteric  Neck: Supple, trachea midline  Lungs:  Scattered rhonchi, normal effort  Heart: S1S2 no rubs  Abdomen:  Soft, nontender, bowel sounds present  Extremities: trace peripheral edema.  Neurologic: Awake, alert, following commands  Skin: No lesions  Access: R IJ permcath    Basic Metabolic Panel:  Recent Labs Lab 06/27/17 0621 06/27/17 1419 06/28/17 0531 07/01/17 0508 07/02/17 0835 07/03/17 0529  NA 137  137 135 135  136 139  --  135  K 3.8  3.8 3.5 3.7  3.7 3.2* 3.3* 4.2  CL 97*  97* 99* 98*  98* 97*  --  93*  CO2 29  30 29 30  31  33*  --  29  GLUCOSE 147*  147* 146* 128*  127* 119*  --  108*  BUN 59*  60* 19 37*  37* 77*  --  100*  CREATININE 2.11*  2.13* 1.01 1.64*  1.62* 2.48*  --  2.69*  CALCIUM 10.3  10.3 8.8* 9.4  9.5 9.9  --  9.5  MG 2.3  --  2.0 2.0  --   --   PHOS 4.2 1.8* 3.4 4.8*  --   --     Liver Function Tests:  Recent Labs Lab 06/27/17 0621 06/27/17 1419 06/28/17 0531 07/01/17 0508  AST  --   --   --  17  ALT  --   --   --  15*  ALKPHOS  --   --   --  68  BILITOT  --   --   --  0.6  PROT  --   --   --  6.3*  ALBUMIN 2.5* 2.4* 2.4* 2.4*   No results for input(s): LIPASE, AMYLASE in the last 168 hours. No results for input(s): AMMONIA in the last 168 hours.  CBC:  Recent Labs Lab 06/27/17 0621 06/28/17 0531 07/01/17 0508  WBC 10.2 10.1 11.0*  HGB 8.8* 8.7* 8.8*  HCT 28.3* 28.8* 28.9*  MCV 100.4* 100.3* 99.3  PLT 296 316 314    Cardiac Enzymes: No results for input(s): CKTOTAL, CKMB,  CKMBINDEX, TROPONINI in the last 168 hours.  BNP: Invalid input(s): POCBNP  CBG: No results for input(s): GLUCAP in the last 168 hours.  Microbiology: No results found for this or any previous visit.  Coagulation Studies:  Recent Labs  07/01/17 0508 07/03/17 0726  LABPROT 28.0* 31.1*  INR 2.65 3.02    Urinalysis: No results for input(s): COLORURINE, LABSPEC, PHURINE, GLUCOSEU, HGBUR, BILIRUBINUR, KETONESUR, PROTEINUR, UROBILINOGEN, NITRITE, LEUKOCYTESUR in the last 72 hours.  Invalid input(s): APPERANCEUR    Imaging: No results found.   Medications:       Assessment/ Plan:  71 y.o. male with a PMHx of Parkinson's disease, history of recent aspiration pneumonia, pleural effusion, acute renal failure requiring dialysis, history of atrial fibrillation, diabetes mellitus type 2, tobacco abuse, hypertension, hyperlipidemia who was admitted to [facility] on 06/21/2017 for ongoing treatment of respiratory failure, recent aspiration pneumonia, DVT, and acute renal failure.  1. 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  Renal function  worsening. BUN up to 100 with a creatinine of 2.69. Therefore we will proceed with a short dialysis treatment today of 2 hours. Ultrafiltration target 1 kg.  2. Anemia of chronic kidney disease.  Hemoglobin was 8.8 at last check. We will continue to monitor. Hold off on Aranesp for now.  3. Secondary hyperparathyroidism.  No new phosphorus today. Most recent serum phosphorus was 4.8 which was higher than before. We will need to continue to periodically monitor.  LOS: 0 [place] Trouble[place] 9/12/20182:48 PM

## 2017-07-04 LAB — PROTIME-INR
INR: 3.27
Prothrombin Time: 33 seconds — ABNORMAL HIGH (ref 11.4–15.2)

## 2017-07-05 LAB — BASIC METABOLIC PANEL
ANION GAP: 10 (ref 5–15)
BUN: 73 mg/dL — ABNORMAL HIGH (ref 6–20)
CALCIUM: 9.4 mg/dL (ref 8.9–10.3)
CO2: 31 mmol/L (ref 22–32)
Chloride: 94 mmol/L — ABNORMAL LOW (ref 101–111)
Creatinine, Ser: 2.54 mg/dL — ABNORMAL HIGH (ref 0.61–1.24)
GFR, EST AFRICAN AMERICAN: 27 mL/min — AB (ref >60)
GFR, EST NON AFRICAN AMERICAN: 24 mL/min — AB (ref >60)
GLUCOSE: 120 mg/dL — AB (ref 65–99)
POTASSIUM: 3.5 mmol/L (ref 3.5–5.1)
SODIUM: 135 mmol/L (ref 135–145)

## 2017-07-05 LAB — PROTIME-INR
INR: 3.31
Prothrombin Time: 33.4 seconds — ABNORMAL HIGH (ref 11.4–15.2)

## 2017-07-05 NOTE — Progress Notes (Signed)
  [facility]  ROUNDING NOTE   Subjective:  Azotemia has improved a bit. BUN down to 73 with a creatinine of 2.54. Patient is still making some urine.   Objective:  Vital signs in last 24 hours:  Temperature 90.8 pulse 99 respirations 21 blood pressure 133/74  Physical Exam: General: No acute distress  Head: Normocephalic, atraumatic. Moist oral mucosal membranes  Eyes: Anicteric  Neck: Supple, trachea midline  Lungs:  Scattered rhonchi, normal effort  Heart: S1S2 no rubs  Abdomen:  Soft, nontender, bowel sounds present  Extremities: trace peripheral edema.  Neurologic: Awake, alert, following commands  Skin: No lesions  Access: R IJ permcath    Basic Metabolic Panel:  Recent Labs Lab 07/01/17 0508 07/02/17 0835 07/03/17 0529 07/03/17 1556 07/05/17 0637  NA 139  --  135 136 135  K 3.2* 3.3* 4.2 3.9 3.5  CL 97*  --  93* 92* 94*  CO2 33*  --  GLUCOSE 119*  --  108* 111* 120*  BUN 77*  --  100* 97* 73*  CREATININE 2.48*  --  2.69* 2.73* 2.54*  CALCIUM 9.9  --  9.5 9.9 9.4  MG 2.0  --   --   --   --   PHOS 4.8*  --   --  4.5  --     Liver Function Tests:  Recent Labs Lab 07/01/17 0508 07/03/17 1556  AST 17  --   ALT 15*  --   ALKPHOS 68  --   BILITOT 0.6  --   PROT 6.3*  --   ALBUMIN 2.4* 2.7*   No results for input(s): LIPASE, AMYLASE in the last 168 hours. No results for input(s): AMMONIA in the last 168 hours.  CBC:  Recent Labs Lab 07/01/17 0508 07/03/17 1556  WBC 11.0* 11.6*  HGB 8.8* 9.5*  HCT 28.9* 30.7*  MCV 99.3 99.7  PLT 314 289    Cardiac Enzymes: No results for input(s): CKTOTAL, CKMB, CKMBINDEX, TROPONINI in the last 168 hours.  BNP: Invalid input(s): POCBNP  CBG: No results for input(s): GLUCAP in the last 168 hours.  Microbiology: No results found for this or any previous visit.  Coagulation Studies:  Recent Labs  07/03/17 0726 07/04/17 0535 07/05/17 0637  LABPROT 31.1* 33.0* 33.4*  INR  3.02 3.27 3.31    Urinalysis: No results for input(s): COLORURINE, LABSPEC, PHURINE, GLUCOSEU, HGBUR, BILIRUBINUR, KETONESUR, PROTEINUR, UROBILINOGEN, NITRITE, LEUKOCYTESUR in the last 72 hours.  Invalid input(s): APPERANCEUR    Imaging: No results found.   Medications:       Assessment/ Plan:  71 y.o. male with a PMHx of Parkinson's disease, history of recent aspiration pneumonia, pleural effusion, acute renal failure requiring dialysis, history of atrial fibrillation, diabetes mellitus type 2, tobacco abuse, hypertension, hyperlipidemia who was admitted to [facility] on 06/21/2017 for ongoing treatment of respiratory failure, recent aspiration pneumonia, DVT, and acute renal failure.  1. 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  Renal parameters did improve with dialysis on Wednesday. No urgent indication for dialysis at the moment. We will consider dialysis again on Monday after seeing him.  2. Anemia of chronic kidney disease.  Hemoglobin up to 9.5. Hold off on Aranesp.  3. Secondary hyperparathyroidism.  Phosphorus 4.5 at last check and acceptable for now. Continue to monitor bone mineral metabolism parameters.   LOS: 0 Lennette Fader 9/14/201810:01 AM

## 2017-07-06 ENCOUNTER — Other Ambulatory Visit (HOSPITAL_COMMUNITY): Payer: Self-pay

## 2017-07-06 LAB — URINALYSIS, ROUTINE W REFLEX MICROSCOPIC
BILIRUBIN URINE: NEGATIVE
Glucose, UA: NEGATIVE mg/dL
KETONES UR: NEGATIVE mg/dL
Leukocytes, UA: NEGATIVE
Nitrite: NEGATIVE
PROTEIN: NEGATIVE mg/dL
SPECIFIC GRAVITY, URINE: 1.012 (ref 1.005–1.030)
pH: 5 (ref 5.0–8.0)

## 2017-07-06 LAB — PROTIME-INR
INR: 3.12
Prothrombin Time: 31.9 seconds — ABNORMAL HIGH (ref 11.4–15.2)

## 2017-07-07 LAB — BLOOD CULTURE ID PANEL (REFLEXED)
Acinetobacter baumannii: NOT DETECTED
CANDIDA ALBICANS: NOT DETECTED
CANDIDA GLABRATA: NOT DETECTED
CANDIDA PARAPSILOSIS: NOT DETECTED
CANDIDA TROPICALIS: NOT DETECTED
Candida krusei: NOT DETECTED
Carbapenem resistance: NOT DETECTED
ENTEROBACTER CLOACAE COMPLEX: NOT DETECTED
ENTEROCOCCUS SPECIES: NOT DETECTED
ESCHERICHIA COLI: NOT DETECTED
Enterobacteriaceae species: NOT DETECTED
Haemophilus influenzae: NOT DETECTED
KLEBSIELLA PNEUMONIAE: NOT DETECTED
Klebsiella oxytoca: NOT DETECTED
LISTERIA MONOCYTOGENES: NOT DETECTED
Methicillin resistance: DETECTED — AB
Neisseria meningitidis: NOT DETECTED
PROTEUS SPECIES: NOT DETECTED
Pseudomonas aeruginosa: NOT DETECTED
SERRATIA MARCESCENS: NOT DETECTED
STREPTOCOCCUS PNEUMONIAE: NOT DETECTED
Staphylococcus aureus (BCID): NOT DETECTED
Staphylococcus species: DETECTED — AB
Streptococcus agalactiae: NOT DETECTED
Streptococcus pyogenes: NOT DETECTED
Streptococcus species: NOT DETECTED
VANCOMYCIN RESISTANCE: NOT DETECTED

## 2017-07-07 LAB — CBC
HEMATOCRIT: 28.3 % — AB (ref 39.0–52.0)
HEMOGLOBIN: 8.8 g/dL — AB (ref 13.0–17.0)
MCH: 30.3 pg (ref 26.0–34.0)
MCHC: 31.1 g/dL (ref 30.0–36.0)
MCV: 97.6 fL (ref 78.0–100.0)
Platelets: 290 10*3/uL (ref 150–400)
RBC: 2.9 MIL/uL — AB (ref 4.22–5.81)
RDW: 15.8 % — ABNORMAL HIGH (ref 11.5–15.5)
WBC: 10.4 10*3/uL (ref 4.0–10.5)

## 2017-07-07 LAB — BASIC METABOLIC PANEL
Anion gap: 12 (ref 5–15)
BUN: 75 mg/dL — ABNORMAL HIGH (ref 6–20)
CALCIUM: 9.1 mg/dL (ref 8.9–10.3)
CO2: 28 mmol/L (ref 22–32)
Chloride: 95 mmol/L — ABNORMAL LOW (ref 101–111)
Creatinine, Ser: 2.92 mg/dL — ABNORMAL HIGH (ref 0.61–1.24)
GFR calc Af Amer: 23 mL/min — ABNORMAL LOW (ref >60)
GFR, EST NON AFRICAN AMERICAN: 20 mL/min — AB (ref >60)
GLUCOSE: 132 mg/dL — AB (ref 65–99)
Potassium: 3 mmol/L — ABNORMAL LOW (ref 3.5–5.1)
Sodium: 135 mmol/L (ref 135–145)

## 2017-07-07 LAB — PROTIME-INR
INR: 3.62
PROTHROMBIN TIME: 35.8 s — AB (ref 11.4–15.2)

## 2017-07-07 LAB — URINE CULTURE

## 2017-07-08 LAB — COMPREHENSIVE METABOLIC PANEL
ALBUMIN: 2.6 g/dL — AB (ref 3.5–5.0)
ALT: 14 U/L — ABNORMAL LOW (ref 17–63)
AST: 18 U/L (ref 15–41)
Alkaline Phosphatase: 66 U/L (ref 38–126)
Anion gap: 13 (ref 5–15)
BILIRUBIN TOTAL: 0.8 mg/dL (ref 0.3–1.2)
BUN: 67 mg/dL — AB (ref 6–20)
CALCIUM: 9.4 mg/dL (ref 8.9–10.3)
CO2: 29 mmol/L (ref 22–32)
CREATININE: 3.03 mg/dL — AB (ref 0.61–1.24)
Chloride: 95 mmol/L — ABNORMAL LOW (ref 101–111)
GFR calc Af Amer: 22 mL/min — ABNORMAL LOW (ref >60)
GFR calc non Af Amer: 19 mL/min — ABNORMAL LOW (ref >60)
Glucose, Bld: 140 mg/dL — ABNORMAL HIGH (ref 65–99)
POTASSIUM: 3.3 mmol/L — AB (ref 3.5–5.1)
Sodium: 137 mmol/L (ref 135–145)
Total Protein: 6.9 g/dL (ref 6.5–8.1)

## 2017-07-08 LAB — PROTIME-INR
INR: 2.61
Prothrombin Time: 27.7 seconds — ABNORMAL HIGH (ref 11.4–15.2)

## 2017-07-08 LAB — PHOSPHORUS: Phosphorus: 3.9 mg/dL (ref 2.5–4.6)

## 2017-07-08 LAB — MAGNESIUM: Magnesium: 2 mg/dL (ref 1.7–2.4)

## 2017-07-08 LAB — TRIGLYCERIDES: TRIGLYCERIDES: 133 mg/dL (ref <150)

## 2017-07-08 NOTE — Progress Notes (Signed)
[facility]  ROUNDING NOTE   Subjective:  Renal function worse again. Creatinine up to 3.03. We talked about administering another dialysis treatment for tomorrow.   Objective:  Vital signs in last 24 hours:  Pulse 82 respirations 24 blood pressure 113/79  Physical Exam: General: No acute distress  Head: Normocephalic, atraumatic. Moist oral mucosal membranes  Eyes: Anicteric  Neck: Supple, trachea midline  Lungs:  Scattered rhonchi, normal effort  Heart: S1S2 no rubs  Abdomen:  Soft, nontender, bowel sounds present  Extremities: trace peripheral edema.  Neurologic: Awake, alert, following commands  Skin: No lesions  Access: R IJ permcath    Basic Metabolic Panel:  Recent Labs Lab 07/03/17 0529 07/03/17 1556 07/05/17 0637 07/07/17 0534 07/08/17 0521  NA 135 136 135 135 137  K 4.2 3.9 3.5 3.0* 3.3*  CL 93* 92* 94* 95* 95*  CO2 GLUCOSE 108* 111* 120* 132* 140*  BUN 100* 97* 73* 75* 67*  CREATININE 2.69* 2.73* 2.54* 2.92* 3.03*  CALCIUM 9.5 9.9 9.4 9.1 9.4  MG  --   --   --   --  2.0  PHOS  --  4.5  --   --  3.9    Liver Function Tests:  Recent Labs Lab 07/03/17 1556 07/08/17 0521  AST  --  18  ALT  --  14*  ALKPHOS  --  66  BILITOT  --  0.8  PROT  --  6.9  ALBUMIN 2.7* 2.6*   No results for input(s): LIPASE, AMYLASE in the last 168 hours. No results for input(s): AMMONIA in the last 168 hours.  CBC:  Recent Labs Lab 07/03/17 1556 07/07/17 0534  WBC 11.6* 10.4  HGB 9.5* 8.8*  HCT 30.7* 28.3*  MCV 99.7 97.6  PLT 289 290    Cardiac Enzymes: No results for input(s): CKTOTAL, CKMB, CKMBINDEX, TROPONINI in the last 168 hours.  BNP: Invalid input(s): POCBNP  CBG: No results for input(s): GLUCAP in the last 168 hours.  Microbiology: Results for orders placed or performed during the hospital encounter of 06/21/17  Culture, blood (routine x 2)     Status: Abnormal (Preliminary result)   Collection Time:  07/06/17 12:14 PM  Result Value Ref Range Status   Specimen Description BLOOD LEFT ANTECUBITAL  Final   Special Requests IN PEDIATRIC BOTTLE Blood Culture adequate volume  Final   Culture  Setup Time   Final    GRAM POSITIVE COCCI IN CLUSTERS IN PEDIATRIC BOTTLE CRITICAL RESULT CALLED TO, READ BACK BY AND VERIFIED WITH: M PERRY,RN AT 1247 07/07/17 BYL BENFIELD    Culture STAPHYLOCOCCUS SPECIES (COAGULASE NEGATIVE) (A)  Final   Report Status PENDING  Incomplete  Blood Culture ID Panel (Reflexed)     Status: Abnormal   Collection Time: 07/06/17 12:14 PM  Result Value Ref Range Status   Enterococcus species NOT DETECTED NOT DETECTED Final   Vancomycin resistance NOT DETECTED NOT DETECTED Final   Listeria monocytogenes NOT DETECTED NOT DETECTED Final   Staphylococcus species DETECTED (A) NOT DETECTED Final    Comment: CRITICAL RESULT CALLED TO, READ BACK BY AND VERIFIED WITH: M PERRY,RN AT 1247 07/07/17 BY L BENFIELD    Staphylococcus aureus NOT DETECTED NOT DETECTED Final   Methicillin resistance DETECTED (A) NOT DETECTED Final    Comment: CRITICAL RESULT CALLED TO, READ BACK BY AND VERIFIED WITH: M PERRY,RN AT 1247 07/07/17 BY L BENFIELD    Streptococcus species NOT DETECTED NOT DETECTED  Final   Streptococcus agalactiae NOT DETECTED NOT DETECTED Final   Streptococcus pneumoniae NOT DETECTED NOT DETECTED Final   Streptococcus pyogenes NOT DETECTED NOT DETECTED Final   Acinetobacter baumannii NOT DETECTED NOT DETECTED Final   Enterobacteriaceae species NOT DETECTED NOT DETECTED Final   Enterobacter cloacae complex NOT DETECTED NOT DETECTED Final   Escherichia coli NOT DETECTED NOT DETECTED Final   Klebsiella oxytoca NOT DETECTED NOT DETECTED Final   Klebsiella pneumoniae NOT DETECTED NOT DETECTED Final   Proteus species NOT DETECTED NOT DETECTED Final   Serratia marcescens NOT DETECTED NOT DETECTED Final   Carbapenem resistance NOT DETECTED NOT DETECTED Final   Haemophilus influenzae  NOT DETECTED NOT DETECTED Final   Neisseria meningitidis NOT DETECTED NOT DETECTED Final   Pseudomonas aeruginosa NOT DETECTED NOT DETECTED Final   Candida albicans NOT DETECTED NOT DETECTED Final   Candida glabrata NOT DETECTED NOT DETECTED Final   Candida krusei NOT DETECTED NOT DETECTED Final   Candida parapsilosis NOT DETECTED NOT DETECTED Final   Candida tropicalis NOT DETECTED NOT DETECTED Final  Culture, blood (routine x 2)     Status: None (Preliminary result)   Collection Time: 07/06/17 12:18 PM  Result Value Ref Range Status   Specimen Description BLOOD LEFT HAND  Final   Special Requests IN PEDIATRIC BOTTLE Blood Culture adequate volume  Final   Culture NO GROWTH 2 DAYS  Final   Report Status PENDING  Incomplete  Culture, Urine     Status: Abnormal   Collection Time: 07/06/17  2:44 PM  Result Value Ref Range Status   Specimen Description URINE, CLEAN CATCH  Final   Special Requests NONE  Final   Culture MULTIPLE SPECIES PRESENT, SUGGEST RECOLLECTION (A)  Final   Report Status 07/07/2017 FINAL  Final  Culture, blood (routine x 2)     Status: None (Preliminary result)   Collection Time: 07/07/17  3:29 PM  Result Value Ref Range Status   Specimen Description BLOOD LEFT HAND  Final   Special Requests   Final    BOTTLES DRAWN AEROBIC ONLY Blood Culture adequate volume   Culture NO GROWTH < 24 HOURS  Final   Report Status PENDING  Incomplete  Culture, blood (routine x 2)     Status: None (Preliminary result)   Collection Time: 07/07/17  3:36 PM  Result Value Ref Range Status   Specimen Description BLOOD LEFT HAND  Final   Special Requests   Final    BOTTLES DRAWN AEROBIC ONLY Blood Culture adequate volume   Culture NO GROWTH < 24 HOURS  Final   Report Status PENDING  Incomplete    Coagulation Studies:  Recent Labs  07/06/17 0642 07/07/17 0534 07/08/17 0521  LABPROT 31.9* 35.8* 27.7*  INR 3.12 3.62 2.61    Urinalysis:  Recent Labs  07/06/17 1444  COLORURINE  YELLOW  LABSPEC 1.012  PHURINE 5.0  GLUCOSEU NEGATIVE  HGBUR LARGE*  BILIRUBINUR NEGATIVE  KETONESUR NEGATIVE  PROTEINUR NEGATIVE  NITRITE NEGATIVE  LEUKOCYTESUR NEGATIVE      Imaging: No results found.   Medications:       Assessment/ Plan:  71 y.o. male with a PMHx of Parkinson's disease, history of recent aspiration pneumonia, pleural effusion, acute renal failure requiring dialysis, history of atrial fibrillation, diabetes mellitus type 2, tobacco abuse, hypertension, hyperlipidemia who was admitted to [facility] on 06/21/2017 for ongoing treatment of respiratory failure, recent aspiration pneumonia, DVT, and acute renal failure.  1. Acute renal failure/chronic kidney disease stage  II.  Creatinine up to 3.03 with a BUN of 67. We will plan for another dialysis session for tomorrow. We will continue to limit ultrafiltration.  2. Anemia of chronic kidney disease.  He will 11 down slightly to 8.8. Hold off on Aranesp for now. Continue to monitor CBC.  3. Secondary hyperparathyroidism.  Recheck serum phosphorus and calcium tomorrow.    LOS: 0 Amberlynn Tempesta 9/17/20184:52 PM

## 2017-07-09 LAB — CBC
HEMATOCRIT: 27.9 % — AB (ref 39.0–52.0)
HEMOGLOBIN: 8.7 g/dL — AB (ref 13.0–17.0)
MCH: 30.6 pg (ref 26.0–34.0)
MCHC: 31.2 g/dL (ref 30.0–36.0)
MCV: 98.2 fL (ref 78.0–100.0)
Platelets: 302 10*3/uL (ref 150–400)
RBC: 2.84 MIL/uL — AB (ref 4.22–5.81)
RDW: 16 % — AB (ref 11.5–15.5)
WBC: 10 10*3/uL (ref 4.0–10.5)

## 2017-07-09 LAB — RENAL FUNCTION PANEL
ANION GAP: 8 (ref 5–15)
Albumin: 2.3 g/dL — ABNORMAL LOW (ref 3.5–5.0)
BUN: 66 mg/dL — AB (ref 6–20)
CHLORIDE: 99 mmol/L — AB (ref 101–111)
CO2: 30 mmol/L (ref 22–32)
Calcium: 9.2 mg/dL (ref 8.9–10.3)
Creatinine, Ser: 2.93 mg/dL — ABNORMAL HIGH (ref 0.61–1.24)
GFR, EST AFRICAN AMERICAN: 23 mL/min — AB (ref >60)
GFR, EST NON AFRICAN AMERICAN: 20 mL/min — AB (ref >60)
Glucose, Bld: 129 mg/dL — ABNORMAL HIGH (ref 65–99)
POTASSIUM: 3.2 mmol/L — AB (ref 3.5–5.1)
Phosphorus: 4.4 mg/dL (ref 2.5–4.6)
Sodium: 137 mmol/L (ref 135–145)

## 2017-07-09 LAB — URINE CULTURE: CULTURE: NO GROWTH

## 2017-07-09 LAB — CULTURE, BLOOD (ROUTINE X 2): Special Requests: ADEQUATE

## 2017-07-09 LAB — VANCOMYCIN, TROUGH: VANCOMYCIN TR: 38 ug/mL — AB (ref 15–20)

## 2017-07-10 ENCOUNTER — Other Ambulatory Visit (HOSPITAL_COMMUNITY): Payer: Self-pay

## 2017-07-10 ENCOUNTER — Encounter (HOSPITAL_COMMUNITY): Payer: Self-pay | Admitting: Radiology

## 2017-07-10 HISTORY — PX: IR REMOVAL TUN CV CATH W/O FL: IMG2289

## 2017-07-10 LAB — BASIC METABOLIC PANEL
Anion gap: 8 (ref 5–15)
BUN: 35 mg/dL — AB (ref 6–20)
CALCIUM: 8.9 mg/dL (ref 8.9–10.3)
CHLORIDE: 101 mmol/L (ref 101–111)
CO2: 29 mmol/L (ref 22–32)
CREATININE: 2.38 mg/dL — AB (ref 0.61–1.24)
GFR calc non Af Amer: 26 mL/min — ABNORMAL LOW (ref >60)
GFR, EST AFRICAN AMERICAN: 30 mL/min — AB (ref >60)
Glucose, Bld: 105 mg/dL — ABNORMAL HIGH (ref 65–99)
Potassium: 3.1 mmol/L — ABNORMAL LOW (ref 3.5–5.1)
Sodium: 138 mmol/L (ref 135–145)

## 2017-07-10 LAB — PROTIME-INR
INR: 1.98
Prothrombin Time: 22.3 seconds — ABNORMAL HIGH (ref 11.4–15.2)

## 2017-07-10 LAB — VANCOMYCIN, TROUGH: Vancomycin Tr: 28 ug/mL (ref 15–20)

## 2017-07-10 MED ORDER — CHLORHEXIDINE GLUCONATE 4 % EX LIQD
CUTANEOUS | Status: AC
  Filled 2017-07-10: qty 15

## 2017-07-10 MED ORDER — LIDOCAINE HCL (PF) 1 % IJ SOLN
INTRAMUSCULAR | Status: AC
  Filled 2017-07-10: qty 30

## 2017-07-10 NOTE — Progress Notes (Signed)
[facility]  ROUNDING NOTE   Subjective:  Coagulase-negative Staphylococcus was noted on blood cultures from 07/06/2017. As such we are in the process of discontinuing PermCath. Patient did undergo dialysis today.   Objective:  Vital signs in last 24 hours:  Temperature 96.9 pulse 121 respirations 16 blood pressure 109/63  Physical Exam: General: No acute distress  Head: Normocephalic, atraumatic. Moist oral mucosal membranes  Eyes: Anicteric  Neck: Supple, trachea midline  Lungs:  Scattered rhonchi, normal effort  Heart: S1S2 no rubs  Abdomen:  Soft, nontender, bowel sounds present  Extremities: trace peripheral edema.  Neurologic: Awake, alert, following commands  Skin: No lesions  Access: R IJ permcath    Basic Metabolic Panel:  Recent Labs Lab 07/03/17 1556 07/05/17 0637 07/07/17 0534 07/08/17 0521 07/09/17 0358 07/10/17 0537  NA 136 135 135 137 137 138  K 3.9 3.5 3.0* 3.3* 3.2* 3.1*  CL 92* 94* 95* 95* 99* 101  CO2 GLUCOSE 111* 120* 132* 140* 129* 105*  BUN 97* 73* 75* 67* 66* 35*  CREATININE 2.73* 2.54* 2.92* 3.03* 2.93* 2.38*  CALCIUM 9.9 9.4 9.1 9.4 9.2 8.9  MG  --   --   --  2.0  --   --   PHOS 4.5  --   --  3.9 4.4  --     Liver Function Tests:  Recent Labs Lab 07/03/17 1556 07/08/17 0521 07/09/17 0358  AST  --  18  --   ALT  --  14*  --   ALKPHOS  --  66  --   BILITOT  --  0.8  --   PROT  --  6.9  --   ALBUMIN 2.7* 2.6* 2.3*   No results for input(s): LIPASE, AMYLASE in the last 168 hours. No results for input(s): AMMONIA in the last 168 hours.  CBC:  Recent Labs Lab 07/03/17 1556 07/07/17 0534 07/09/17 0358  WBC 11.6* 10.4 10.0  HGB 9.5* 8.8* 8.7*  HCT 30.7* 28.3* 27.9*  MCV 99.7 97.6 98.2  PLT 289 290 302    Cardiac Enzymes: No results for input(s): CKTOTAL, CKMB, CKMBINDEX, TROPONINI in the last 168 hours.  BNP: Invalid input(s): POCBNP  CBG: No results for input(s): GLUCAP in the  last 168 hours.  Microbiology: Results for orders placed or performed during the hospital encounter of 06/21/17  Culture, blood (routine x 2)     Status: Abnormal   Collection Time: 07/06/17 12:14 PM  Result Value Ref Range Status   Specimen Description BLOOD LEFT ANTECUBITAL  Final   Special Requests IN PEDIATRIC BOTTLE Blood Culture adequate volume  Final   Culture  Setup Time   Final    GRAM POSITIVE COCCI IN CLUSTERS IN PEDIATRIC BOTTLE CRITICAL RESULT CALLED TO, READ BACK BY AND VERIFIED WITH: M PERRY,RN AT 1247 07/07/17 BYL BENFIELD    Culture (A)  Final    STAPHYLOCOCCUS SPECIES (COAGULASE NEGATIVE) THE SIGNIFICANCE OF ISOLATING THIS ORGANISM FROM A SINGLE SET OF BLOOD CULTURES WHEN MULTIPLE SETS ARE DRAWN IS UNCERTAIN. PLEASE NOTIFY THE MICROBIOLOGY DEPARTMENT WITHIN ONE WEEK IF SPECIATION AND SENSITIVITIES ARE REQUIRED.    Report Status 07/09/2017 FINAL  Final  Blood Culture ID Panel (Reflexed)     Status: Abnormal   Collection Time: 07/06/17 12:14 PM  Result Value Ref Range Status   Enterococcus species NOT DETECTED NOT DETECTED Final   Vancomycin resistance NOT DETECTED NOT DETECTED Final   Listeria monocytogenes NOT  DETECTED NOT DETECTED Final   Staphylococcus species DETECTED (A) NOT DETECTED Final    Comment: CRITICAL RESULT CALLED TO, READ BACK BY AND VERIFIED WITH: M PERRY,RN AT 1247 07/07/17 BY L BENFIELD    Staphylococcus aureus NOT DETECTED NOT DETECTED Final   Methicillin resistance DETECTED (A) NOT DETECTED Final    Comment: CRITICAL RESULT CALLED TO, READ BACK BY AND VERIFIED WITH: M PERRY,RN AT 1247 07/07/17 BY L BENFIELD    Streptococcus species NOT DETECTED NOT DETECTED Final   Streptococcus agalactiae NOT DETECTED NOT DETECTED Final   Streptococcus pneumoniae NOT DETECTED NOT DETECTED Final   Streptococcus pyogenes NOT DETECTED NOT DETECTED Final   Acinetobacter baumannii NOT DETECTED NOT DETECTED Final   Enterobacteriaceae species NOT DETECTED NOT  DETECTED Final   Enterobacter cloacae complex NOT DETECTED NOT DETECTED Final   Escherichia coli NOT DETECTED NOT DETECTED Final   Klebsiella oxytoca NOT DETECTED NOT DETECTED Final   Klebsiella pneumoniae NOT DETECTED NOT DETECTED Final   Proteus species NOT DETECTED NOT DETECTED Final   Serratia marcescens NOT DETECTED NOT DETECTED Final   Carbapenem resistance NOT DETECTED NOT DETECTED Final   Haemophilus influenzae NOT DETECTED NOT DETECTED Final   Neisseria meningitidis NOT DETECTED NOT DETECTED Final   Pseudomonas aeruginosa NOT DETECTED NOT DETECTED Final   Candida albicans NOT DETECTED NOT DETECTED Final   Candida glabrata NOT DETECTED NOT DETECTED Final   Candida krusei NOT DETECTED NOT DETECTED Final   Candida parapsilosis NOT DETECTED NOT DETECTED Final   Candida tropicalis NOT DETECTED NOT DETECTED Final  Culture, blood (routine x 2)     Status: None (Preliminary result)   Collection Time: 07/06/17 12:18 PM  Result Value Ref Range Status   Specimen Description BLOOD LEFT HAND  Final   Special Requests IN PEDIATRIC BOTTLE Blood Culture adequate volume  Final   Culture NO GROWTH 3 DAYS  Final   Report Status PENDING  Incomplete  Culture, Urine     Status: Abnormal   Collection Time: 07/06/17  2:44 PM  Result Value Ref Range Status   Specimen Description URINE, CLEAN CATCH  Final   Special Requests NONE  Final   Culture MULTIPLE SPECIES PRESENT, SUGGEST RECOLLECTION (A)  Final   Report Status 07/07/2017 FINAL  Final  Culture, blood (routine x 2)     Status: None (Preliminary result)   Collection Time: 07/07/17  3:29 PM  Result Value Ref Range Status   Specimen Description BLOOD LEFT HAND  Final   Special Requests   Final    BOTTLES DRAWN AEROBIC ONLY Blood Culture adequate volume   Culture NO GROWTH 2 DAYS  Final   Report Status PENDING  Incomplete  Culture, blood (routine x 2)     Status: None (Preliminary result)   Collection Time: 07/07/17  3:36 PM  Result Value  Ref Range Status   Specimen Description BLOOD LEFT HAND  Final   Special Requests   Final    BOTTLES DRAWN AEROBIC ONLY Blood Culture adequate volume   Culture NO GROWTH 2 DAYS  Final   Report Status PENDING  Incomplete  Culture, Urine     Status: None   Collection Time: 07/08/17 11:28 AM  Result Value Ref Range Status   Specimen Description URINE, RANDOM  Final   Special Requests NONE  Final   Culture NO GROWTH  Final   Report Status 07/09/2017 FINAL  Final    Coagulation Studies:  Recent Labs  07/08/17 0521 07/10/17 0537  LABPROT 27.7* 22.3*  INR 2.61 1.98    Urinalysis: No results for input(s): COLORURINE, LABSPEC, PHURINE, GLUCOSEU, HGBUR, BILIRUBINUR, KETONESUR, PROTEINUR, UROBILINOGEN, NITRITE, LEUKOCYTESUR in the last 72 hours.  Invalid input(s): APPERANCEUR    Imaging: No results found.   Medications:       Assessment/ Plan:  71 y.o. male with a PMHx of Parkinson's disease, history of recent aspiration pneumonia, pleural effusion, acute renal failure requiring dialysis, history of atrial fibrillation, diabetes mellitus type 2, tobacco abuse, hypertension, hyperlipidemia who was admitted to [facility] on 06/21/2017 for ongoing treatment of respiratory failure, recent aspiration pneumonia, DVT, and acute renal failure.  1. 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  Patient did undergo dialysis yesterday and BUN is down to 35 with a creatinine of 2.3. He is still making some urine. We will go ahead and discontinue PermCath for now. We may need to replace one if renal function declines.  2. Anemia of chronic kidney disease.  He will 11 down slightly to 8.8. Hold off on Aranesp for now. Continue to monitor CBC.  3. Secondary hyperparathyroidism.  Recheck serum phosphorus and calcium tomorrow.    LOS: 0 William Price 9/19/201811:39 AM

## 2017-07-10 NOTE — Procedures (Signed)
Successful removal of (R)IJ Permcath Tip sent for culture No complications.  Brayton El PA-C Interventional Radiology 07/10/2017 3:17 PM

## 2017-07-11 LAB — CBC
HCT: 27.8 % — ABNORMAL LOW (ref 39.0–52.0)
Hemoglobin: 8.7 g/dL — ABNORMAL LOW (ref 13.0–17.0)
MCH: 30.9 pg (ref 26.0–34.0)
MCHC: 31.3 g/dL (ref 30.0–36.0)
MCV: 98.6 fL (ref 78.0–100.0)
PLATELETS: 279 10*3/uL (ref 150–400)
RBC: 2.82 MIL/uL — ABNORMAL LOW (ref 4.22–5.81)
RDW: 15.9 % — ABNORMAL HIGH (ref 11.5–15.5)
WBC: 9.7 10*3/uL (ref 4.0–10.5)

## 2017-07-11 LAB — CULTURE, BLOOD (ROUTINE X 2)
CULTURE: NO GROWTH
SPECIAL REQUESTS: ADEQUATE

## 2017-07-11 LAB — RENAL FUNCTION PANEL
ALBUMIN: 2.2 g/dL — AB (ref 3.5–5.0)
ANION GAP: 11 (ref 5–15)
Albumin: 2.4 g/dL — ABNORMAL LOW (ref 3.5–5.0)
Anion gap: 9 (ref 5–15)
BUN: 37 mg/dL — AB (ref 6–20)
BUN: 36 mg/dL — AB (ref 6–20)
CALCIUM: 9.1 mg/dL (ref 8.9–10.3)
CHLORIDE: 100 mmol/L — AB (ref 101–111)
CO2: 30 mmol/L (ref 22–32)
CO2: 27 mmol/L (ref 22–32)
Calcium: 9 mg/dL (ref 8.9–10.3)
Chloride: 99 mmol/L — ABNORMAL LOW (ref 101–111)
Creatinine, Ser: 2.52 mg/dL — ABNORMAL HIGH (ref 0.61–1.24)
Creatinine, Ser: 2.61 mg/dL — ABNORMAL HIGH (ref 0.61–1.24)
GFR calc Af Amer: 28 mL/min — ABNORMAL LOW (ref >60)
GFR calc Af Amer: 27 mL/min — ABNORMAL LOW (ref >60)
GFR calc non Af Amer: 24 mL/min — ABNORMAL LOW (ref >60)
GFR, EST NON AFRICAN AMERICAN: 23 mL/min — AB (ref >60)
GLUCOSE: 101 mg/dL — AB (ref 65–99)
GLUCOSE: 111 mg/dL — AB (ref 65–99)
PHOSPHORUS: 4.2 mg/dL (ref 2.5–4.6)
PHOSPHORUS: 4.2 mg/dL (ref 2.5–4.6)
POTASSIUM: 3.2 mmol/L — AB (ref 3.5–5.1)
Potassium: 3 mmol/L — ABNORMAL LOW (ref 3.5–5.1)
SODIUM: 138 mmol/L (ref 135–145)
Sodium: 138 mmol/L (ref 135–145)

## 2017-07-11 LAB — PROTIME-INR
INR: 3.09
PROTHROMBIN TIME: 31.6 s — AB (ref 11.4–15.2)

## 2017-07-11 LAB — MAGNESIUM: MAGNESIUM: 1.8 mg/dL (ref 1.7–2.4)

## 2017-07-11 LAB — VANCOMYCIN, TROUGH: Vancomycin Tr: 22 ug/mL (ref 15–20)

## 2017-07-12 LAB — RENAL FUNCTION PANEL
ALBUMIN: 2.2 g/dL — AB (ref 3.5–5.0)
ANION GAP: 10 (ref 5–15)
BUN: 34 mg/dL — ABNORMAL HIGH (ref 6–20)
CO2: 26 mmol/L (ref 22–32)
Calcium: 9 mg/dL (ref 8.9–10.3)
Chloride: 102 mmol/L (ref 101–111)
Creatinine, Ser: 2.69 mg/dL — ABNORMAL HIGH (ref 0.61–1.24)
GFR, EST AFRICAN AMERICAN: 26 mL/min — AB (ref >60)
GFR, EST NON AFRICAN AMERICAN: 22 mL/min — AB (ref >60)
Glucose, Bld: 96 mg/dL (ref 65–99)
PHOSPHORUS: 4.1 mg/dL (ref 2.5–4.6)
POTASSIUM: 3.7 mmol/L (ref 3.5–5.1)
Sodium: 138 mmol/L (ref 135–145)

## 2017-07-12 LAB — CBC
HCT: 28.5 % — ABNORMAL LOW (ref 39.0–52.0)
Hemoglobin: 8.8 g/dL — ABNORMAL LOW (ref 13.0–17.0)
MCH: 30.3 pg (ref 26.0–34.0)
MCHC: 30.9 g/dL (ref 30.0–36.0)
MCV: 98.3 fL (ref 78.0–100.0)
PLATELETS: 286 10*3/uL (ref 150–400)
RBC: 2.9 MIL/uL — AB (ref 4.22–5.81)
RDW: 15.9 % — ABNORMAL HIGH (ref 11.5–15.5)
WBC: 10.5 10*3/uL (ref 4.0–10.5)

## 2017-07-12 LAB — CULTURE, BLOOD (ROUTINE X 2)
CULTURE: NO GROWTH
CULTURE: NO GROWTH
Special Requests: ADEQUATE
Special Requests: ADEQUATE

## 2017-07-12 LAB — PROTIME-INR
INR: 4.45 — AB
Prothrombin Time: 42.8 seconds — ABNORMAL HIGH (ref 11.4–15.2)

## 2017-07-12 LAB — MAGNESIUM: Magnesium: 1.8 mg/dL (ref 1.7–2.4)

## 2017-07-12 LAB — VANCOMYCIN, TROUGH: Vancomycin Tr: 18 ug/mL (ref 15–20)

## 2017-07-12 NOTE — Progress Notes (Signed)
[facility]  ROUNDING NOTE   Subjective:  Renal function appears to be stable at the moment. Creatinine currently 2.6. Case discussed with the patient and his family in depth today.   Objective:  Vital signs in last 24 hours:  Temperature 98.1 pulse 116 respirations 20 blood pressure 120/59  Physical Exam: General: No acute distress  Head: Normocephalic, atraumatic. Moist oral mucosal membranes  Eyes: Anicteric  Neck: Supple, trachea midline  Lungs:  Scattered rhonchi, normal effort  Heart: S1S2 no rubs  Abdomen:  Soft, nontender, bowel sounds present  Extremities: no peripheral edema.  Neurologic: Awake, alert, following commands  Skin: No lesions  Access: none    Basic Metabolic Panel:  Recent Labs Lab 07/08/17 0521 07/09/17 0358 07/10/17 0537 07/11/17 0512 07/11/17 1136 07/12/17 0512  NA 137 137 138 138 138 138  K 3.3* 3.2* 3.1* 3.0* 3.2* 3.7  CL 95* 99* 101 99* 100* 102  CO2 GLUCOSE 140* 129* 105* 101* 111* 96  BUN 67* 66* 35* 37* 36* 34*  CREATININE 3.03* 2.93* 2.38* 2.52* 2.61* 2.69*  CALCIUM 9.4 9.2 8.9 9.1 9.0 9.0  MG 2.0  --   --  1.8  --  1.8  PHOS 3.9 4.4  --  4.2 4.2 4.1    Liver Function Tests:  Recent Labs Lab 07/08/17 0521 07/09/17 0358 07/11/17 0512 07/11/17 1136 07/12/17 0512  AST 18  --   --   --   --   ALT 14*  --   --   --   --   ALKPHOS 66  --   --   --   --   BILITOT 0.8  --   --   --   --   PROT 6.9  --   --   --   --   ALBUMIN 2.6* 2.3* 2.2* 2.4* 2.2*   No results for input(s): LIPASE, AMYLASE in the last 168 hours. No results for input(s): AMMONIA in the last 168 hours.  CBC:  Recent Labs Lab 07/07/17 0534 07/09/17 0358 07/11/17 0512 07/12/17 0512  WBC 10.4 10.0 9.7 10.5  HGB 8.8* 8.7* 8.7* 8.8*  HCT 28.3* 27.9* 27.8* 28.5*  MCV 97.6 98.2 98.6 98.3  PLT 290 302 279 286    Cardiac Enzymes: No results for input(s): CKTOTAL, CKMB, CKMBINDEX, TROPONINI in the last 168  hours.  BNP: Invalid input(s): POCBNP  CBG: No results for input(s): GLUCAP in the last 168 hours.  Microbiology: Results for orders placed or performed during the hospital encounter of 06/21/17  Culture, blood (routine x 2)     Status: Abnormal   Collection Time: 07/06/17 12:14 PM  Result Value Ref Range Status   Specimen Description BLOOD LEFT ANTECUBITAL  Final   Special Requests IN PEDIATRIC BOTTLE Blood Culture adequate volume  Final   Culture  Setup Time   Final    GRAM POSITIVE COCCI IN CLUSTERS IN PEDIATRIC BOTTLE CRITICAL RESULT CALLED TO, READ BACK BY AND VERIFIED WITH: M PERRY,RN AT 1247 07/07/17 BYL BENFIELD    Culture (A)  Final    STAPHYLOCOCCUS SPECIES (COAGULASE NEGATIVE) THE SIGNIFICANCE OF ISOLATING THIS ORGANISM FROM A SINGLE SET OF BLOOD CULTURES WHEN MULTIPLE SETS ARE DRAWN IS UNCERTAIN. PLEASE NOTIFY THE MICROBIOLOGY DEPARTMENT WITHIN ONE WEEK IF SPECIATION AND SENSITIVITIES ARE REQUIRED.    Report Status 07/09/2017 FINAL  Final  Blood Culture ID Panel (Reflexed)     Status: Abnormal   Collection Time:  07/06/17 12:14 PM  Result Value Ref Range Status   Enterococcus species NOT DETECTED NOT DETECTED Final   Vancomycin resistance NOT DETECTED NOT DETECTED Final   Listeria monocytogenes NOT DETECTED NOT DETECTED Final   Staphylococcus species DETECTED (A) NOT DETECTED Final    Comment: CRITICAL RESULT CALLED TO, READ BACK BY AND VERIFIED WITH: M PERRY,RN AT 1247 07/07/17 BY L BENFIELD    Staphylococcus aureus NOT DETECTED NOT DETECTED Final   Methicillin resistance DETECTED (A) NOT DETECTED Final    Comment: CRITICAL RESULT CALLED TO, READ BACK BY AND VERIFIED WITH: M PERRY,RN AT 1247 07/07/17 BY L BENFIELD    Streptococcus species NOT DETECTED NOT DETECTED Final   Streptococcus agalactiae NOT DETECTED NOT DETECTED Final   Streptococcus pneumoniae NOT DETECTED NOT DETECTED Final   Streptococcus pyogenes NOT DETECTED NOT DETECTED Final   Acinetobacter  baumannii NOT DETECTED NOT DETECTED Final   Enterobacteriaceae species NOT DETECTED NOT DETECTED Final   Enterobacter cloacae complex NOT DETECTED NOT DETECTED Final   Escherichia coli NOT DETECTED NOT DETECTED Final   Klebsiella oxytoca NOT DETECTED NOT DETECTED Final   Klebsiella pneumoniae NOT DETECTED NOT DETECTED Final   Proteus species NOT DETECTED NOT DETECTED Final   Serratia marcescens NOT DETECTED NOT DETECTED Final   Carbapenem resistance NOT DETECTED NOT DETECTED Final   Haemophilus influenzae NOT DETECTED NOT DETECTED Final   Neisseria meningitidis NOT DETECTED NOT DETECTED Final   Pseudomonas aeruginosa NOT DETECTED NOT DETECTED Final   Candida albicans NOT DETECTED NOT DETECTED Final   Candida glabrata NOT DETECTED NOT DETECTED Final   Candida krusei NOT DETECTED NOT DETECTED Final   Candida parapsilosis NOT DETECTED NOT DETECTED Final   Candida tropicalis NOT DETECTED NOT DETECTED Final  Culture, blood (routine x 2)     Status: None   Collection Time: 07/06/17 12:18 PM  Result Value Ref Range Status   Specimen Description BLOOD LEFT HAND  Final   Special Requests IN PEDIATRIC BOTTLE Blood Culture adequate volume  Final   Culture NO GROWTH 5 DAYS  Final   Report Status 07/11/2017 FINAL  Final  Culture, Urine     Status: Abnormal   Collection Time: 07/06/17  2:44 PM  Result Value Ref Range Status   Specimen Description URINE, CLEAN CATCH  Final   Special Requests NONE  Final   Culture MULTIPLE SPECIES PRESENT, SUGGEST RECOLLECTION (A)  Final   Report Status 07/07/2017 FINAL  Final  Culture, blood (routine x 2)     Status: None   Collection Time: 07/07/17  3:29 PM  Result Value Ref Range Status   Specimen Description BLOOD LEFT HAND  Final   Special Requests   Final    BOTTLES DRAWN AEROBIC ONLY Blood Culture adequate volume   Culture NO GROWTH 5 DAYS  Final   Report Status 07/12/2017 FINAL  Final  Culture, blood (routine x 2)     Status: None   Collection Time:  07/07/17  3:36 PM  Result Value Ref Range Status   Specimen Description BLOOD LEFT HAND  Final   Special Requests   Final    BOTTLES DRAWN AEROBIC ONLY Blood Culture adequate volume   Culture NO GROWTH 5 DAYS  Final   Report Status 07/12/2017 FINAL  Final  Culture, Urine     Status: None   Collection Time: 07/08/17 11:28 AM  Result Value Ref Range Status   Specimen Description URINE, RANDOM  Final   Special Requests NONE  Final  Culture NO GROWTH  Final   Report Status 07/09/2017 FINAL  Final  Cath Tip Culture     Status: None (Preliminary result)   Collection Time: 07/10/17  3:03 PM  Result Value Ref Range Status   Specimen Description CATH TIP HEMODIALYSIS CATHETER  Final   Special Requests NONE  Final   Culture NO GROWTH 2 DAYS  Final   Report Status PENDING  Incomplete    Coagulation Studies:  Recent Labs  07/10/17 0537 07/11/17 0512 07/12/17 1130  LABPROT 22.3* 31.6* 42.8*  INR 1.98 3.09 4.45*    Urinalysis: No results for input(s): COLORURINE, LABSPEC, PHURINE, GLUCOSEU, HGBUR, BILIRUBINUR, KETONESUR, PROTEINUR, UROBILINOGEN, NITRITE, LEUKOCYTESUR in the last 72 hours.  Invalid input(s): APPERANCEUR    Imaging: No results found.   Medications:       Assessment/ Plan:  71 y.o. male with a PMHx of Parkinson's disease, history of recent aspiration pneumonia, pleural effusion, acute renal failure requiring dialysis, history of atrial fibrillation, diabetes mellitus type 2, tobacco abuse, hypertension, hyperlipidemia who was admitted to [facility] on 06/21/2017 for ongoing treatment of respiratory failure, recent aspiration pneumonia, DVT, and acute renal failure.  1. 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 - Right IJ PermCath has been discontinued. Renal function appears to be stable at the moment with a creatinine of 2.69. If renal function remains stable over the weekend we will plan for discharge with close nephrology outpatient  follow-up. He was seeing a nephrologist in [place] and we recommend that he be seen by Dr. Frazier Richards in a short period of time after discharge.  2. Anemia of chronic kidney disease.  Hemoglobin 8.8 at last check. He will likely need Epogen as an outpatient.  3. Secondary hyperparathyroidism.  Phosphorus 4.1 and at target.   LOS: 0 Kendallyn Lippold 9/21/20186:26 PM

## 2017-07-13 LAB — CBC
HEMATOCRIT: 28.1 % — AB (ref 39.0–52.0)
Hemoglobin: 8.9 g/dL — ABNORMAL LOW (ref 13.0–17.0)
MCH: 30.7 pg (ref 26.0–34.0)
MCHC: 31.7 g/dL (ref 30.0–36.0)
MCV: 96.9 fL (ref 78.0–100.0)
PLATELETS: 299 10*3/uL (ref 150–400)
RBC: 2.9 MIL/uL — AB (ref 4.22–5.81)
RDW: 15.7 % — ABNORMAL HIGH (ref 11.5–15.5)
WBC: 10.3 10*3/uL (ref 4.0–10.5)

## 2017-07-13 LAB — CATH TIP CULTURE: Culture: NO GROWTH

## 2017-07-13 LAB — PROTIME-INR
INR: 3.58
Prothrombin Time: 35.5 seconds — ABNORMAL HIGH (ref 11.4–15.2)

## 2017-07-14 LAB — PROTIME-INR
INR: 3.13
INR: 3.04
PROTHROMBIN TIME: 32 s — AB (ref 11.4–15.2)
Prothrombin Time: 31.3 seconds — ABNORMAL HIGH (ref 11.4–15.2)

## 2017-07-14 LAB — RENAL FUNCTION PANEL
ALBUMIN: 2.2 g/dL — AB (ref 3.5–5.0)
ANION GAP: 15 (ref 5–15)
BUN: 30 mg/dL — ABNORMAL HIGH (ref 6–20)
CO2: 22 mmol/L (ref 22–32)
Calcium: 9.2 mg/dL (ref 8.9–10.3)
Chloride: 99 mmol/L — ABNORMAL LOW (ref 101–111)
Creatinine, Ser: 2.56 mg/dL — ABNORMAL HIGH (ref 0.61–1.24)
GFR calc non Af Amer: 23 mL/min — ABNORMAL LOW (ref >60)
GFR, EST AFRICAN AMERICAN: 27 mL/min — AB (ref >60)
Glucose, Bld: 114 mg/dL — ABNORMAL HIGH (ref 65–99)
PHOSPHORUS: 4.1 mg/dL (ref 2.5–4.6)
Potassium: 3.7 mmol/L (ref 3.5–5.1)
SODIUM: 136 mmol/L (ref 135–145)

## 2017-07-14 LAB — CBC
HCT: 29.9 % — ABNORMAL LOW (ref 39.0–52.0)
HEMOGLOBIN: 9.5 g/dL — AB (ref 13.0–17.0)
MCH: 31 pg (ref 26.0–34.0)
MCHC: 31.8 g/dL (ref 30.0–36.0)
MCV: 97.7 fL (ref 78.0–100.0)
Platelets: 359 10*3/uL (ref 150–400)
RBC: 3.06 MIL/uL — AB (ref 4.22–5.81)
RDW: 15.9 % — ABNORMAL HIGH (ref 11.5–15.5)
WBC: 9.9 10*3/uL (ref 4.0–10.5)

## 2017-07-14 LAB — MAGNESIUM: MAGNESIUM: 1.6 mg/dL — AB (ref 1.7–2.4)

## 2017-07-14 LAB — VANCOMYCIN, TROUGH: Vancomycin Tr: 18 ug/mL (ref 15–20)

## 2017-07-15 LAB — COMPREHENSIVE METABOLIC PANEL
ALBUMIN: 2.2 g/dL — AB (ref 3.5–5.0)
ALK PHOS: 60 U/L (ref 38–126)
ALT: 12 U/L — AB (ref 17–63)
AST: 14 U/L — AB (ref 15–41)
Anion gap: 11 (ref 5–15)
BILIRUBIN TOTAL: 0.9 mg/dL (ref 0.3–1.2)
BUN: 32 mg/dL — AB (ref 6–20)
CALCIUM: 9 mg/dL (ref 8.9–10.3)
CO2: 27 mmol/L (ref 22–32)
CREATININE: 2.63 mg/dL — AB (ref 0.61–1.24)
Chloride: 96 mmol/L — ABNORMAL LOW (ref 101–111)
GFR calc Af Amer: 26 mL/min — ABNORMAL LOW (ref >60)
GFR, EST NON AFRICAN AMERICAN: 23 mL/min — AB (ref >60)
GLUCOSE: 95 mg/dL (ref 65–99)
Potassium: 2.8 mmol/L — ABNORMAL LOW (ref 3.5–5.1)
Sodium: 134 mmol/L — ABNORMAL LOW (ref 135–145)
TOTAL PROTEIN: 6.3 g/dL — AB (ref 6.5–8.1)

## 2017-07-15 LAB — CBC
HCT: 29.9 % — ABNORMAL LOW (ref 39.0–52.0)
Hemoglobin: 9.3 g/dL — ABNORMAL LOW (ref 13.0–17.0)
MCH: 30 pg (ref 26.0–34.0)
MCHC: 31.1 g/dL (ref 30.0–36.0)
MCV: 96.5 fL (ref 78.0–100.0)
PLATELETS: 363 10*3/uL (ref 150–400)
RBC: 3.1 MIL/uL — AB (ref 4.22–5.81)
RDW: 15.4 % (ref 11.5–15.5)
WBC: 8.8 10*3/uL (ref 4.0–10.5)

## 2017-07-15 LAB — MAGNESIUM
MAGNESIUM: 1.5 mg/dL — AB (ref 1.7–2.4)
Magnesium: 2 mg/dL (ref 1.7–2.4)

## 2017-07-15 LAB — VANCOMYCIN, TROUGH: Vancomycin Tr: 14 ug/mL — ABNORMAL LOW (ref 15–20)

## 2017-07-15 LAB — PHOSPHORUS: Phosphorus: 4.1 mg/dL (ref 2.5–4.6)

## 2017-07-15 LAB — PROTIME-INR
INR: 2.56
PROTHROMBIN TIME: 27.3 s — AB (ref 11.4–15.2)

## 2017-07-15 LAB — TRIGLYCERIDES: Triglycerides: 157 mg/dL — ABNORMAL HIGH (ref <150)

## 2017-07-15 LAB — POTASSIUM: Potassium: 4.2 mmol/L (ref 3.5–5.1)

## 2017-07-15 NOTE — Progress Notes (Signed)
[facility]  ROUNDING NOTE   Subjective:  Patient seen at bedside. Renal function appears to be stable.  creatinine 2.6. Therefore it does not appear that he needs any further dialysis at this time.   Objective:  Vital signs in last 24 hours:  Temperature 98.1 pulse 116 respirations 20 blood pressure 120/59  Physical Exam: General: No acute distress  Head: Normocephalic, atraumatic. Moist oral mucosal membranes  Eyes: Anicteric  Neck: Supple, trachea midline  Lungs:  Scattered rhonchi, normal effort  Heart: S1S2 no rubs  Abdomen:  Soft, nontender, bowel sounds present  Extremities: no peripheral edema.  Neurologic: Awake, alert, following commands  Skin: No lesions  Access: none    Basic Metabolic Panel:  Recent Labs Lab 07/11/17 0512 07/11/17 1136 07/12/17 0512 07/14/17 0949 07/15/17 0616  NA 138 138 138 136 134*  K 3.0* 3.2* 3.7 3.7 2.8*  CL 99* 100* 102 99* 96*  CO2 GLUCOSE 101* 111* 96 114* 95  BUN 37* 36* 34* 30* 32*  CREATININE 2.52* 2.61* 2.69* 2.56* 2.63*  CALCIUM 9.1 9.0 9.0 9.2 9.0  MG 1.8  --  1.8 1.6* 1.5*  PHOS 4.2 4.2 4.1 4.1 4.1    Liver Function Tests:  Recent Labs Lab 07/11/17 0512 07/11/17 1136 07/12/17 0512 07/14/17 0949 07/15/17 0616  AST  --   --   --   --  14*  ALT  --   --   --   --  12*  ALKPHOS  --   --   --   --  60  BILITOT  --   --   --   --  0.9  PROT  --   --   --   --  6.3*  ALBUMIN 2.2* 2.4* 2.2* 2.2* 2.2*   No results for input(s): LIPASE, AMYLASE in the last 168 hours. No results for input(s): AMMONIA in the last 168 hours.  CBC:  Recent Labs Lab 07/11/17 0512 07/12/17 0512 07/13/17 0909 07/14/17 0949 07/15/17 0616  WBC 9.7 10.5 10.3 9.9 8.8  HGB 8.7* 8.8* 8.9* 9.5* 9.3*  HCT 27.8* 28.5* 28.1* 29.9* 29.9*  MCV 98.6 98.3 96.9 97.7 96.5  PLT 279 286 299 359 363    Cardiac Enzymes: No results for input(s): CKTOTAL, CKMB, CKMBINDEX, TROPONINI in the last 168  hours.  BNP: Invalid input(s): POCBNP  CBG: No results for input(s): GLUCAP in the last 168 hours.  Microbiology: Results for orders placed or performed during the hospital encounter of 06/21/17  Culture, blood (routine x 2)     Status: Abnormal   Collection Time: 07/06/17 12:14 PM  Result Value Ref Range Status   Specimen Description BLOOD LEFT ANTECUBITAL  Final   Special Requests IN PEDIATRIC BOTTLE Blood Culture adequate volume  Final   Culture  Setup Time   Final    GRAM POSITIVE COCCI IN CLUSTERS IN PEDIATRIC BOTTLE CRITICAL RESULT CALLED TO, READ BACK BY AND VERIFIED WITH: M PERRY,RN AT 1247 07/07/17 BYL BENFIELD    Culture (A)  Final    STAPHYLOCOCCUS SPECIES (COAGULASE NEGATIVE) THE SIGNIFICANCE OF ISOLATING THIS ORGANISM FROM A SINGLE SET OF BLOOD CULTURES WHEN MULTIPLE SETS ARE DRAWN IS UNCERTAIN. PLEASE NOTIFY THE MICROBIOLOGY DEPARTMENT WITHIN ONE WEEK IF SPECIATION AND SENSITIVITIES ARE REQUIRED.    Report Status 07/09/2017 FINAL  Final  Blood Culture ID Panel (Reflexed)     Status: Abnormal   Collection Time: 07/06/17 12:14 PM  Result Value Ref  Range Status   Enterococcus species NOT DETECTED NOT DETECTED Final   Vancomycin resistance NOT DETECTED NOT DETECTED Final   Listeria monocytogenes NOT DETECTED NOT DETECTED Final   Staphylococcus species DETECTED (A) NOT DETECTED Final    Comment: CRITICAL RESULT CALLED TO, READ BACK BY AND VERIFIED WITH: M PERRY,RN AT 1247 07/07/17 BY L BENFIELD    Staphylococcus aureus NOT DETECTED NOT DETECTED Final   Methicillin resistance DETECTED (A) NOT DETECTED Final    Comment: CRITICAL RESULT CALLED TO, READ BACK BY AND VERIFIED WITH: M PERRY,RN AT 1247 07/07/17 BY L BENFIELD    Streptococcus species NOT DETECTED NOT DETECTED Final   Streptococcus agalactiae NOT DETECTED NOT DETECTED Final   Streptococcus pneumoniae NOT DETECTED NOT DETECTED Final   Streptococcus pyogenes NOT DETECTED NOT DETECTED Final   Acinetobacter  baumannii NOT DETECTED NOT DETECTED Final   Enterobacteriaceae species NOT DETECTED NOT DETECTED Final   Enterobacter cloacae complex NOT DETECTED NOT DETECTED Final   Escherichia coli NOT DETECTED NOT DETECTED Final   Klebsiella oxytoca NOT DETECTED NOT DETECTED Final   Klebsiella pneumoniae NOT DETECTED NOT DETECTED Final   Proteus species NOT DETECTED NOT DETECTED Final   Serratia marcescens NOT DETECTED NOT DETECTED Final   Carbapenem resistance NOT DETECTED NOT DETECTED Final   Haemophilus influenzae NOT DETECTED NOT DETECTED Final   Neisseria meningitidis NOT DETECTED NOT DETECTED Final   Pseudomonas aeruginosa NOT DETECTED NOT DETECTED Final   Candida albicans NOT DETECTED NOT DETECTED Final   Candida glabrata NOT DETECTED NOT DETECTED Final   Candida krusei NOT DETECTED NOT DETECTED Final   Candida parapsilosis NOT DETECTED NOT DETECTED Final   Candida tropicalis NOT DETECTED NOT DETECTED Final  Culture, blood (routine x 2)     Status: None   Collection Time: 07/06/17 12:18 PM  Result Value Ref Range Status   Specimen Description BLOOD LEFT HAND  Final   Special Requests IN PEDIATRIC BOTTLE Blood Culture adequate volume  Final   Culture NO GROWTH 5 DAYS  Final   Report Status 07/11/2017 FINAL  Final  Culture, Urine     Status: Abnormal   Collection Time: 07/06/17  2:44 PM  Result Value Ref Range Status   Specimen Description URINE, CLEAN CATCH  Final   Special Requests NONE  Final   Culture MULTIPLE SPECIES PRESENT, SUGGEST RECOLLECTION (A)  Final   Report Status 07/07/2017 FINAL  Final  Culture, blood (routine x 2)     Status: None   Collection Time: 07/07/17  3:29 PM  Result Value Ref Range Status   Specimen Description BLOOD LEFT HAND  Final   Special Requests   Final    BOTTLES DRAWN AEROBIC ONLY Blood Culture adequate volume   Culture NO GROWTH 5 DAYS  Final   Report Status 07/12/2017 FINAL  Final  Culture, blood (routine x 2)     Status: None   Collection Time:  07/07/17  3:36 PM  Result Value Ref Range Status   Specimen Description BLOOD LEFT HAND  Final   Special Requests   Final    BOTTLES DRAWN AEROBIC ONLY Blood Culture adequate volume   Culture NO GROWTH 5 DAYS  Final   Report Status 07/12/2017 FINAL  Final  Culture, Urine     Status: None   Collection Time: 07/08/17 11:28 AM  Result Value Ref Range Status   Specimen Description URINE, RANDOM  Final   Special Requests NONE  Final   Culture NO GROWTH  Final  Report Status 07/09/2017 FINAL  Final  Cath Tip Culture     Status: None   Collection Time: 07/10/17  3:03 PM  Result Value Ref Range Status   Specimen Description CATH TIP HEMODIALYSIS CATHETER  Final   Special Requests NONE  Final   Culture NO GROWTH 3 DAYS  Final   Report Status 07/13/2017 FINAL  Final    Coagulation Studies:  Recent Labs  07/13/17 0849 07/14/17 0949 07/14/17 1803 07/15/17 0616  LABPROT 35.5* 32.0* 31.3* 27.3*  INR 3.58 3.13 3.04 2.56    Urinalysis: No results for input(s): COLORURINE, LABSPEC, PHURINE, GLUCOSEU, HGBUR, BILIRUBINUR, KETONESUR, PROTEINUR, UROBILINOGEN, NITRITE, LEUKOCYTESUR in the last 72 hours.  Invalid input(s): APPERANCEUR    Imaging: No results found.   Medications:       Assessment/ Plan:  71 y.o. male with a PMHx of Parkinson's disease, history of recent aspiration pneumonia, pleural effusion, acute renal failure requiring dialysis, history of atrial fibrillation, diabetes mellitus type 2, tobacco abuse, hypertension, hyperlipidemia who was admitted to [facility] on 06/21/2017 for ongoing treatment of respiratory failure, recent aspiration pneumonia, DVT, and acute renal failure.  1. 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 -  It appears that the patient's renal function is stabilized. Creatinine is currently 2.6. He continues to produce urine. Therefore no need to restart him on dialysis at this time. He will need to follow-up with [facility] with Dr. Frazier Richards in particular.  2. Anemia of chronic kidney disease.  Hemoglobin up to 9.3 now. He may need Epogen as an outpatient. Recommend continued monitoring of CBC has an outpatient.  3. Secondary hyperparathyroidism.  Phosphorus continues to be stable at 4.1.   LOS: 0 William Price 9/24/20183:02 PM

## 2017-10-22 DEATH — deceased

## 2018-12-27 IMAGING — DX DG ABDOMEN 1V
1 series · 1 of 1 positions shown · non-contrast
Comparison: None.

CLINICAL DATA: PEG tube placement.

EXAM:
ABDOMEN - 1 VIEW

[abdomen kub]
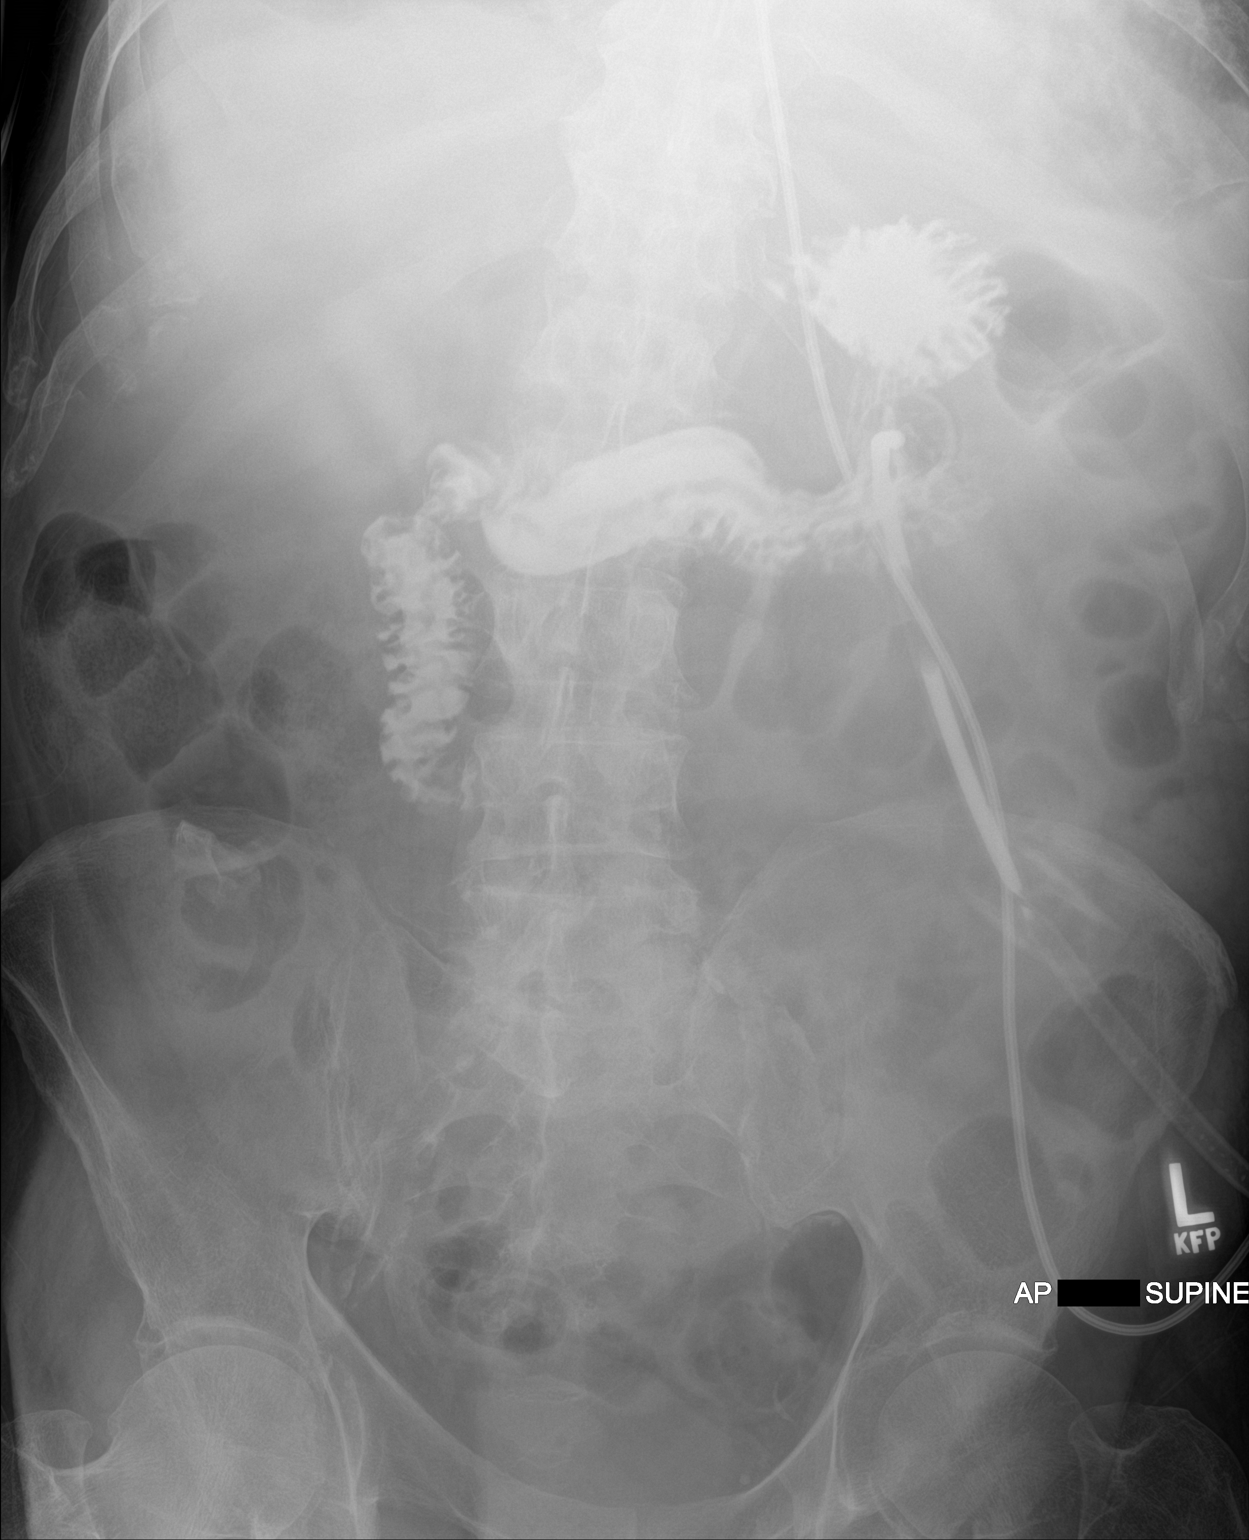

[1 of 1 positions shown; findings below may reference images not displayed]

FINDINGS: Normal bowel gas pattern. Injected contrast in the PEG tube and
stomach and proximal duodenum. The PEG tube tip is in the proximal
to mid stomach. No extravasated contrast is seen. Lumbar and lower
thoracic spine degenerative changes and mild scoliosis.
IMPRESSION: PEG tube tip and balloon in the proximal to mid stomach. No
extravasation.

## 2019-01-02 IMAGING — DX DG CHEST 1V PORT
1 series · 1 of 1 positions shown · non-contrast
Comparison: June 21, 2017

CLINICAL DATA: Shortness of Breath

EXAM:
PORTABLE CHEST 1 VIEW

[chest ap]
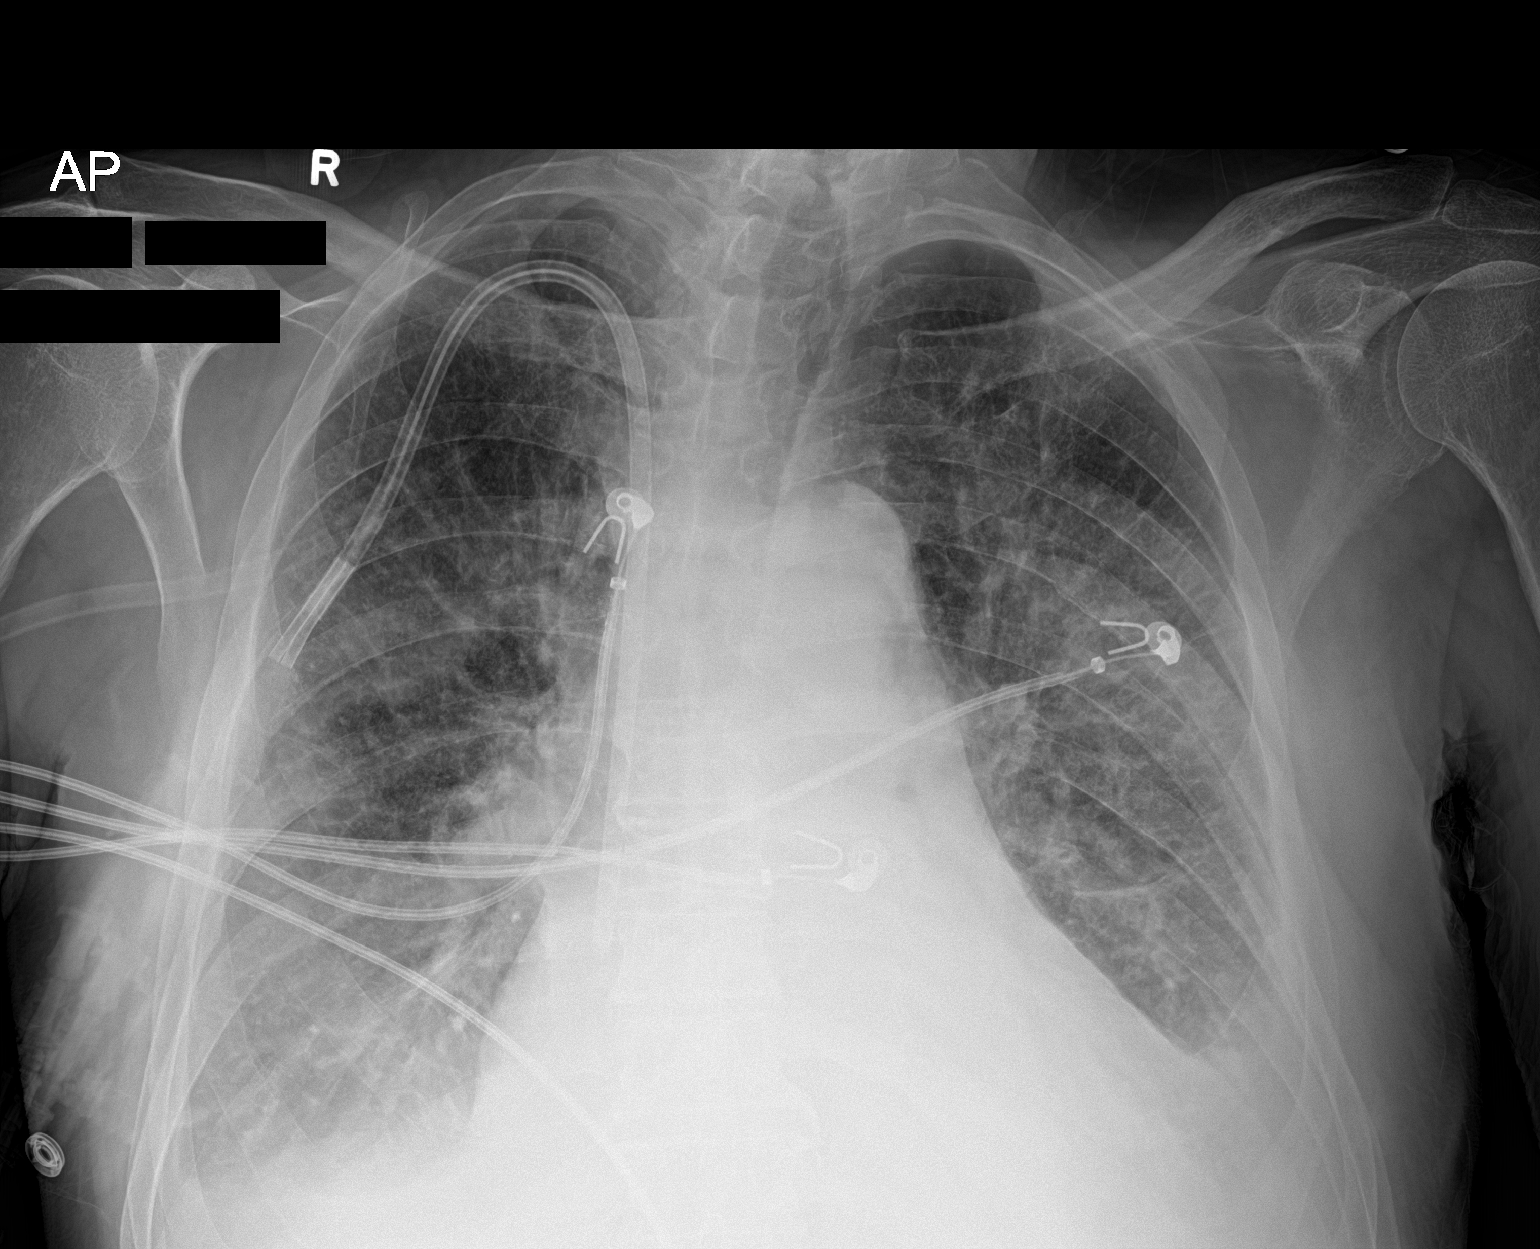

[1 of 1 positions shown; findings below may reference images not displayed]

FINDINGS: There are bilateral pleural effusions. There is interstitial edema
bilaterally. There is patchy airspace opacity in the upper lobes,
more on the left than on the right. There is consolidation in the
left lower lobe. There is cardiomegaly with pulmonary venous
hypertension. No evident adenopathy.

Central catheter tip is at the cavoatrial junction. No pneumothorax.
There is aortic atherosclerosis. No bone lesions.
IMPRESSION: Findings indicative of congestive heart failure with increase in
edema compared to most recent study. Suspect alveolar edema left
upper lobe. Consolidation left lower lobe raises concern for
superimposed pneumonia, although this opacity could be due to a
combination of alveolar edema and atelectasis.

There is aortic atherosclerosis.

Central catheter as described without pneumothorax.

Aortic Atherosclerosis ([id number]).
